# Patient Record
Sex: Female | Born: 1985 | Race: Black or African American | Hispanic: No | Marital: Single | Smoking: Current every day smoker
Health system: Southern US, Community
[De-identification: ages and names within clinical notes are randomized; demographics above are authoritative.]

## PROBLEM LIST (undated history)

## (undated) DIAGNOSIS — K259 Gastric ulcer, unspecified as acute or chronic, without hemorrhage or perforation: Secondary | ICD-10-CM

## (undated) DIAGNOSIS — R066 Hiccough: Secondary | ICD-10-CM

## (undated) DIAGNOSIS — F419 Anxiety disorder, unspecified: Secondary | ICD-10-CM

## (undated) DIAGNOSIS — K219 Gastro-esophageal reflux disease without esophagitis: Secondary | ICD-10-CM

## (undated) HISTORY — PX: NO PAST SURGERIES: SHX2092

## (undated) HISTORY — PX: LEG SURGERY: SHX1003

---

## 2006-07-11 ENCOUNTER — Emergency Department (HOSPITAL_COMMUNITY): Admission: EM | Admit: 2006-07-11 | Discharge: 2006-07-11 | Payer: Self-pay | Admitting: Emergency Medicine

## 2007-02-26 ENCOUNTER — Emergency Department (HOSPITAL_COMMUNITY): Admission: EM | Admit: 2007-02-26 | Discharge: 2007-02-26 | Payer: Self-pay | Admitting: Emergency Medicine

## 2009-03-16 ENCOUNTER — Ambulatory Visit: Payer: Self-pay | Admitting: Psychiatry

## 2009-03-16 ENCOUNTER — Emergency Department (HOSPITAL_COMMUNITY): Admission: EM | Admit: 2009-03-16 | Discharge: 2009-03-16 | Payer: Self-pay | Admitting: Emergency Medicine

## 2009-03-16 ENCOUNTER — Inpatient Hospital Stay (HOSPITAL_COMMUNITY): Admission: AD | Admit: 2009-03-16 | Discharge: 2009-03-18 | Payer: Self-pay | Admitting: Psychiatry

## 2010-10-22 LAB — CBC
HCT: 40.4 % (ref 36.0–46.0)
Platelets: 275 10*3/uL (ref 150–400)
WBC: 6.4 10*3/uL (ref 4.0–10.5)

## 2010-10-22 LAB — POCT PREGNANCY, URINE: Preg Test, Ur: NEGATIVE

## 2010-10-22 LAB — RAPID URINE DRUG SCREEN, HOSP PERFORMED
Amphetamines: NOT DETECTED
Barbiturates: NOT DETECTED
Benzodiazepines: NOT DETECTED

## 2010-10-22 LAB — BASIC METABOLIC PANEL
GFR calc Af Amer: >60 mL/min (ref >60)
GFR calc non Af Amer: >60 mL/min (ref >60)
Potassium: 3.5 mEq/L (ref 3.5–5.1)
Sodium: 137 mEq/L (ref 135–145)

## 2010-10-22 LAB — DIFFERENTIAL
Basophils Relative: 0 % (ref 0–1)
Eosinophils Absolute: 0 10*3/uL (ref 0.0–0.7)
Lymphs Abs: 1.1 10*3/uL (ref 0.7–4.0)
Neutrophils Relative %: 76 % (ref 43–77)

## 2010-11-29 NOTE — H&P (Signed)
[name], [name]                ACCOUNT NO.:  [id number]   MEDICAL RECORD NO.:  [id number]          PATIENT TYPE:  IPS   LOCATION:  0306                          FACILITY:  [facility]   PHYSICIAN:  Anselm Jungling, MD  DATE OF BIRTH:  09-12-1985   DATE OF ADMISSION:  03/16/2009  DATE OF DISCHARGE:                       PSYCHIATRIC ADMISSION ASSESSMENT   TIME:  0930.   IDENTIFYING INFORMATION:  A 25 year old female.  This is a voluntary  admission.   HISTORY OF PRESENT ILLNESS:  First [facility] admission for this is 25 year old  who was brought to the emergency room by her family.  Complaining of not  sleeping for the past 3 days.  She today is fully oriented.  Recognizes  that something is off in her thinking.  Admitting to having issues with  anxiety.  She is quite hyperverbal with rapid speech pace, but  cooperative.  Before arriving in the emergency room, she had taken a  trazodone 50 mg belonging to someone else that she believed was aspirin.   PAST PSYCHIATRIC HISTORY:  No previous treatment.  No history of  psychotropics.  No history of learning disability or brain injury.   SOCIAL HISTORY:  Single female enrolled in school, studying nursing.  Reports she is living with her grandmother.  No legal problems.  Denies  a history of alcohol or substance abuse.   FAMILY HISTORY:  Mother and maternal grandmother, both with bipolar  disorder.   PRIMARY CARE Solly Derasmo:  No regular primary care Kellyanne Ellwanger.   MEDICAL PROBLEMS:  None.   PAST MEDICAL HISTORY:  Childhood asthma.   CURRENT MEDICATIONS:  None.   DRUG ALLERGIES:  NONE.   PHYSICAL EXAMINATION:  Physical exam was done in the emergency room as  noted in the record.   LABORATORY DATA:  Chemistry is normal.  BUN 8, creatinine 0.67.  Urine  drug screen negative for all substances.  Alcohol level less than 5.  Normal CBC.  Urine pregnancy negative.   MENTAL STATUS EXAM:  Fully alert female, oriented x4, enjoyable, polite.  Nonpressured speech with rapid pace and hyperverbal.  Has a lot of  energy.  Affect and mood are bright.  Thinking, nonpsychotic.  Denying  any substance abuse.  Memory intact.   AXIS I:  Rule out bipolar disorder not otherwise specified, hypomanic.  AXIS II:  No diagnosis.  AXIS III:  No diagnosis.  AXIS IV:  Deferred.  AXIS V:  Current 44, past year not known.   PLAN:  The plan is to voluntarily admit her for stabilization.  We are  going to get in touch with her grandmother and hear any concerns that  she has.  She is on Ambien 5 mg h.s. p.r.n. insomnia and we are starting  her on Depakote 500 mg ER p.o. b.i.d.      Young Berry. Lorin Picket, N.P.      Anselm Jungling, MD  Electronically Signed    MAS/MEDQ  D:  03/17/2009  T:  03/17/2009  Job:  [id number]

## 2010-11-29 NOTE — Discharge Summary (Signed)
[name], BRIGHT NO.:  [id number]   MEDICAL RECORD NO.:  [id number]          PATIENT TYPE:  IPS   LOCATION:  0306                          FACILITY:  [facility]   PHYSICIAN:  Jasmine Pang, M.D. DATE OF BIRTH:  [birth year]   DATE OF ADMISSION:  03/16/2009  DATE OF DISCHARGE:  03/18/2009                               DISCHARGE SUMMARY   IDENTIFYING INFORMATION:  This is 25 year old single African American  female who was admitted on a voluntary basis on March 16, 2009.   HISTORY OF PRESENT ILLNESS:  This is the first [facility] admission for this 25-  year-old who was brought to the emergency room by her family.  She is  complaining of not sleeping for the past 3 days.  Today, she is fully  oriented.  She recognizes that something is often her thinking.  She is  admitted to having issues with anxiety.  She is quite hyperverbal with  rapid speech space, but cooperative.  Before arriving in the emergency  room, she had taken of trazodone 50 mg belonging someone else because  she believed it was aspirin.  She had no previous treatment.  No history  of psychotropics.  No history of learning disability or brain injury.  Upon admission, the patient was given an Axis I diagnosis of rule out  bipolar disorder, not otherwise specified, hypomanic.  On Axis III,  there was no diagnosis.   PHYSICAL FINDINGS:  There were no acute physical or medical problems  noted.  Her physical exam was done in the emergency room and is in her  record.   DIAGNOSTIC STUDIES:  Chemistry is normal.  BUN is 8, creatinine is 0.67.  Urine drug screen is negative for all substances.  Alcohol level less  than 5.  Normal CBC.  Urine pregnancy screen negative.   HOSPITAL COURSE:  Upon admission, the patient was started on Ambien 5 mg  p.o. q.h.s. p.r.n., may repeat times 1 if needed and Ativan 1 mg p.o.  q.6 h. p.r.n. anxiety.  She was also started on Depakote 500 mg p.o.  b.i.d.  The patient was less  hypomanic.  The following day, she had a  family session with her mother and grandmother, they were very  supportive, they felt comfortable taking her home and letting her adjust  to her Depakote there, she is not working and is not in school, and they  will be with her.  She did not feel she could rest as well on our unit.  She did sleep well on March 17, 2009.  On March 18, 2009, mental  status had improved from admission status.  The patient was still  somewhat hyperverbal, but the parents with mother and grandmother status  is normal for her.  Her mood was euthymic.  Affect wide range.  No  suicidal or homicidal ideation.  No thoughts of self injurious behavior.  No paranoia or delusions.  Thoughts were logical and goal-directed.  Thought content, no predominant theme.  Cognitive was grossly intact.  Insight good.  Judgment good.  Impulse control good. The  patient wanted  to go home today and was felt to be safe for discharge.  There was  indicated above her grandmother and mother are going to be with her  while she is adjusting to the Depakote.   DISCHARGE DIAGNOSES:  Axis I:  Single bipolar disorder, hypomanic.  Axis II:  None.  Axis III:  None.  AXIS IV:  Moderate (burden of psychiatric illness, but supportive family  is an asset).  Axis V:  Global assessment of functioning was 50 upon discharge.  Global  assessment of functioning was 44 upon admission.  Global assessment of  functioning highest past year was 65-70.   DISCHARGE PLANS:  There were no specific activity level or dietary  restrictions.   POSTHOSPITAL CARE PLANS:  The patient will go to the [facility] on  March 19, 2009 at 1:30 p.m.   DISCHARGE MEDICATIONS:  1. Depakote tablets 500 mg b.i.d.  2. Ambien 5 mg at bedtime if needed for sleep.      Jasmine Pang, M.D.  Electronically Signed     BHS/MEDQ  D:  03/18/2009  T:  03/18/2009  Job:  [id number]

## 2011-01-06 ENCOUNTER — Emergency Department (HOSPITAL_COMMUNITY)
Admission: EM | Admit: 2011-01-06 | Discharge: 2011-01-06 | Disposition: A | Discharge status: Home / Self Care | Payer: Self-pay | Attending: Emergency Medicine | Admitting: Emergency Medicine

## 2011-01-06 DIAGNOSIS — M7989 Other specified soft tissue disorders: Secondary | ICD-10-CM | POA: Insufficient documentation

## 2011-01-06 DIAGNOSIS — J45909 Unspecified asthma, uncomplicated: Secondary | ICD-10-CM | POA: Insufficient documentation

## 2011-01-06 DIAGNOSIS — W208XXA Other cause of strike by thrown, projected or falling object, initial encounter: Secondary | ICD-10-CM | POA: Insufficient documentation

## 2011-01-06 DIAGNOSIS — M79609 Pain in unspecified limb: Secondary | ICD-10-CM | POA: Insufficient documentation

## 2011-01-06 DIAGNOSIS — S5010XA Contusion of unspecified forearm, initial encounter: Secondary | ICD-10-CM | POA: Insufficient documentation

## 2011-01-06 DIAGNOSIS — Y92009 Unspecified place in unspecified non-institutional (private) residence as the place of occurrence of the external cause: Secondary | ICD-10-CM | POA: Insufficient documentation

## 2011-01-06 LAB — POCT PREGNANCY, URINE: Preg Test, Ur: NEGATIVE

## 2011-05-01 LAB — URINALYSIS, ROUTINE W REFLEX MICROSCOPIC
Bilirubin Urine: NEGATIVE
Hgb urine dipstick: NEGATIVE
Ketones, ur: NEGATIVE
Protein, ur: NEGATIVE

## 2011-05-01 LAB — DIFFERENTIAL
Eosinophils Absolute: 0.2
Eosinophils Relative: 2
Lymphocytes Relative: 14
Lymphs Abs: 1.4
Monocytes Absolute: 0.6
Monocytes Relative: 7

## 2011-05-01 LAB — CBC
Platelets: 317
WBC: 9.8

## 2011-05-01 LAB — POCT I-STAT CREATININE
Creatinine, Ser: 0.8
Operator id: 234501

## 2011-05-01 LAB — WET PREP, GENITAL: WBC, Wet Prep HPF POC: NONE SEEN

## 2011-05-01 LAB — I-STAT 8, (EC8 V) (CONVERTED LAB)
Acid-base deficit: 2
Chloride: 102
HCT: 38
Potassium: 3.9
pH, Ven: 7.325 — ABNORMAL HIGH

## 2011-05-01 LAB — RPR: RPR Ser Ql: NONREACTIVE

## 2011-09-19 ENCOUNTER — Emergency Department (HOSPITAL_COMMUNITY)
Admission: EM | Admit: 2011-09-19 | Discharge: 2011-09-20 | Disposition: A | Discharge status: Home / Self Care | Payer: Self-pay | Attending: Emergency Medicine | Admitting: Emergency Medicine

## 2011-09-19 ENCOUNTER — Other Ambulatory Visit: Payer: Self-pay

## 2011-09-19 DIAGNOSIS — F410 Panic disorder [episodic paroxysmal anxiety] without agoraphobia: Secondary | ICD-10-CM | POA: Insufficient documentation

## 2011-09-19 DIAGNOSIS — R209 Unspecified disturbances of skin sensation: Secondary | ICD-10-CM | POA: Insufficient documentation

## 2011-09-19 DIAGNOSIS — R064 Hyperventilation: Secondary | ICD-10-CM | POA: Insufficient documentation

## 2011-09-19 DIAGNOSIS — F419 Anxiety disorder, unspecified: Secondary | ICD-10-CM

## 2011-09-19 DIAGNOSIS — F411 Generalized anxiety disorder: Secondary | ICD-10-CM | POA: Insufficient documentation

## 2011-09-19 MED ORDER — ALPRAZOLAM 0.5 MG PO TABS
0.2500 mg | ORAL_TABLET | Freq: Once | ORAL | Status: AC
  Administered 2011-09-19: 0.25 mg via ORAL
  Filled 2011-09-19: qty 1

## 2011-09-19 MED ORDER — ALPRAZOLAM 0.25 MG PO TABS: 0.2500 mg | ORAL_TABLET | Freq: Every evening | ORAL | Status: AC | PRN

## 2011-09-19 NOTE — Discharge Instructions (Signed)

## 2011-09-19 NOTE — ED Notes (Signed)
Pt in waiting room reports feeling better; no signs of distress; reports tightness in chest but no difficulty breathing anymore.

## 2011-09-19 NOTE — ED Notes (Signed)
States 3 panic attacks this week; has been able to calm self during first 2 episodes; however, not tonight; states has been under a lot of stressors; mother's best friend just died so pt has been trying to care for mother and support her emotionally; states "I feel overwhelmed and overexerted" ; tonight pt developed sob, chest pain, feeling "out of control" - did not resolve on own; hence, she presented to ED; appears calm upon entering room; however, speech is pressured; denies SI nor HI at this time; states has previously saw MD for anxiety; however, no long term treatment; significant other at bedside

## 2011-09-19 NOTE — ED Provider Notes (Signed)
History     CSN: [id number]  Arrival date & time 09/19/11  1925   First MD Initiated Contact with Patient 09/19/11 2213      Chief Complaint  Patient presents with  . Panic Attack    (Consider location/radiation/quality/duration/timing/severity/associated sxs/prior treatment) HPI Pt has history of panic attacks and was under increased stress today and started hyperventilating, had the feeling of suffocation, chest tightness, and bl hand tingling. Since pt has been in ED Her symptoms are resolving. No fever chills, cough, N/V, leg swelling.  No past medical history on file.  No past surgical history on file.  No family history on file.  History  Substance Use Topics  . Smoking status: Not on file  . Smokeless tobacco: Not on file  . Alcohol Use: Not on file    OB History    No data available      Review of Systems  Constitutional: Negative for fever and chills.  HENT: Positive for neck pain.   Respiratory: Positive for chest tightness and shortness of breath. Negative for cough and wheezing.   Cardiovascular: Negative for chest pain, palpitations and leg swelling.  Neurological: Negative for dizziness, weakness, numbness and headaches.  Psychiatric/Behavioral: Negative for suicidal ideas and self-injury. The patient is nervous/anxious.     Allergies  Review of patient's allergies indicates no known allergies.  Home Medications   Current Outpatient Rx  Name Route Sig Dispense Refill  . ALPRAZOLAM 0.25 MG PO TABS Oral Take 1 tablet (0.25 mg total) by mouth at bedtime as needed for anxiety. 30 tablet 0    BP 124/75  Pulse 98  Temp(Src) 98.3 F (36.8 C) (Oral)  Resp 17  SpO2 100%  Physical Exam  Nursing note and vitals reviewed. Constitutional: She is oriented to person, place, and time. She appears well-developed and well-nourished. No distress.       Pressured speech  HENT:  Head: Normocephalic and atraumatic.  Mouth/Throat: Oropharynx is clear and  moist.  Eyes: EOM are normal. Pupils are equal, round, and reactive to light.  Neck: Normal range of motion. Neck supple.  Cardiovascular: Normal rate and regular rhythm.  Exam reveals no gallop and no friction rub.   No murmur heard. Pulmonary/Chest: Effort normal and breath sounds normal. No respiratory distress. She has no wheezes. She has no rales.  Abdominal: Soft. Bowel sounds are normal. There is no tenderness. There is no rebound and no guarding.  Musculoskeletal: Normal range of motion. She exhibits no edema and no tenderness.       No calf tenderness  Neurological: She is alert and oriented to person, place, and time.       5/5 motor in all ext, sensation intact  Skin: Skin is warm and dry. No rash noted. No erythema.  Psychiatric:       Anxious appearing    ED Course  Procedures (including critical care time)  Labs Reviewed - No data to display No results found.   1. Anxiety      Date: 09/19/2011  Rate: 94  Rhythm: normal sinus rhythm  QRS Axis: normal  Intervals: normal  ST/T Wave abnormalities: normal  Conduction Disutrbances:none  Narrative Interpretation:   Old EKG Reviewed: unchanged    MDM  Will treat symptomatically and reassess.     Feeling better. ACT to give resources for outpatient followup. Return for conocerns  Loren Racer, MD 09/19/11 2337

## 2011-09-19 NOTE — ED Notes (Signed)
Pt c/o panic attack onset approx 1 hr ago.  Pt st's she was breathing fast then hands started going numb.  St's chest  Felt tight

## 2011-10-24 ENCOUNTER — Emergency Department (HOSPITAL_COMMUNITY)
Admission: EM | Admit: 2011-10-24 | Discharge: 2011-10-24 | Disposition: A | Discharge status: Home / Self Care | Payer: Self-pay | Attending: Emergency Medicine | Admitting: Emergency Medicine

## 2011-10-24 ENCOUNTER — Encounter (HOSPITAL_COMMUNITY): Payer: Self-pay | Admitting: Emergency Medicine

## 2011-10-24 ENCOUNTER — Emergency Department (HOSPITAL_COMMUNITY): Payer: Self-pay

## 2011-10-24 DIAGNOSIS — R066 Hiccough: Secondary | ICD-10-CM | POA: Insufficient documentation

## 2011-10-24 DIAGNOSIS — F411 Generalized anxiety disorder: Secondary | ICD-10-CM | POA: Insufficient documentation

## 2011-10-24 DIAGNOSIS — R079 Chest pain, unspecified: Secondary | ICD-10-CM | POA: Insufficient documentation

## 2011-10-24 DIAGNOSIS — J45909 Unspecified asthma, uncomplicated: Secondary | ICD-10-CM | POA: Insufficient documentation

## 2011-10-24 HISTORY — DX: Anxiety disorder, unspecified: F41.9

## 2011-10-24 MED ORDER — GI COCKTAIL ~~LOC~~
30.0000 mL | Freq: Once | ORAL | Status: AC
  Administered 2011-10-24: 30 mL via ORAL
  Filled 2011-10-24: qty 30

## 2011-10-24 MED ORDER — FAMOTIDINE 20 MG PO TABS: 20.0000 mg | ORAL_TABLET | Freq: Two times a day (BID) | ORAL | Status: DC

## 2011-10-24 MED ORDER — FAMOTIDINE 20 MG PO TABS
20.0000 mg | ORAL_TABLET | Freq: Once | ORAL | Status: AC
  Administered 2011-10-24: 20 mg via ORAL
  Filled 2011-10-24: qty 1

## 2011-10-24 NOTE — ED Notes (Addendum)
Pt reports approx 4 days of intermittent hiccups and belching, progressively worse and more consistent - pt states she gets some relief of sx after taking gas-x. Pt c/o upper chest soreness. Pt in no acute distress, friend at bedside.

## 2011-10-24 NOTE — ED Notes (Signed)
PT. REPORTS INTERMITTENT MID CHEST PAIN WITH VOMITTING AND SOB FOR SEVERAL DAYS . DENIES COUGH OR CONGESTION .

## 2011-10-24 NOTE — Discharge Instructions (Signed)
 RESOURCE GUIDE  Dental Problems  Patients with Medicaid: [facility]                     [facility] [address]                                           [address] Phone:  [phone]                                                  Phone:  [phone]  If unable to pay or uninsured, contact:  [facility] or [facility] to become qualified for the adult dental clinic.  Chronic Pain Problems Contact [facility]  [phone] Patients need to be referred by their primary care doctor.  Insufficient Money for Medicine Contact [employer]:  call "211" or [facility] [phone].  No Primary Care Doctor Call [facility]  [phone] Other agencies that provide inexpensive medical care    [facility]  [phone]    [facility]  [phone]    [facility]  [phone]    [facility] Clinic  [phone]    [facility]  [phone]    [facility]  [phone]  Psychological Services [facility]  [phone] [facility]  [phone] [facility]   [phone] (emergency services [phone])  Substance Abuse Resources [facility]  [phone] [facility] 336-784-9470 [facility] 336-285-9073 [facility] 336-845-3988 Residential & Outpatient Substance Abuse Program  800-659-3381  Abuse/Neglect [place] Child Abuse Hotline (336) 641-3795 [place] Child Abuse Hotline 800-378-5315 (After Hours)  Emergency Shelter [facility] (336) 271-5985  [facility] (336) 275-9566 [facility] (704) [phone]  MRSA Hotline #:   832-7006    [place] Resources  [facility] [place]     [employer]                          [facility] [address] [place]                       [address]      [address]  [place]                                                [place]                            [place] Phone:  [phone]                                   Phone:  [phone]                 Phone:  [phone]  [facility] Phone:    [phone]  [place] Child Abuse Hotline (336) 342-1394 (336) 342-3537 (After Hours)   

## 2011-10-24 NOTE — ED Provider Notes (Signed)
History     CSN: [id number]  Arrival date & time 10/24/11  0302   First MD Initiated Contact with Patient 10/24/11 [phone]      Chief Complaint  Patient presents with  . Chest Pain    (Consider location/radiation/quality/duration/timing/severity/associated sxs/prior treatment) HPI Comments: 26 year old female who has mild asthma and anxiety presents with approximately 36 hours of intermittent burping and hiccuping. She states that this started gradually, has gradually worsened and is now persistent. She admits to having mild chest pain when she hiccups, no coughing or shortness of breath, no back pain no diarrhea no dysuria no rashes no swelling. She is in no medications prior to arrival, nothing makes this better or worse. She denies having excessive flatus and has had normal bowel movements.  Patient is a 26 y.o. female presenting with chest pain. The history is provided by the patient and a friend.  Chest Pain     Past Medical History  Diagnosis Date  . Anxiety   . Asthma     History reviewed. No pertinent past surgical history.  No family history on file.  History  Substance Use Topics  . Smoking status: Current Everyday Smoker  . Smokeless tobacco: Not on file  . Alcohol Use: No    OB History    Grav Para Term Preterm Abortions TAB SAB Ect Mult Living                  Review of Systems  Cardiovascular: Positive for chest pain.  All other systems reviewed and are negative.    Allergies  Review of patient's allergies indicates no known allergies.  Home Medications   Current Outpatient Rx  Name Route Sig Dispense Refill  . ALPRAZOLAM 0.25 MG PO TABS Oral Take 0.25 mg by mouth at bedtime as needed. For sleep    . OVER THE COUNTER MEDICATION Oral Take 1 capsule by mouth at bedtime as needed. For congestion and sleep    . SIMETHICONE 80 MG PO CHEW Oral Chew 80 mg by mouth every 6 (six) hours as needed. For stomach upset    . FAMOTIDINE 20 MG PO TABS Oral Take  1 tablet (20 mg total) by mouth 2 (two) times daily. 30 tablet 0    BP 117/75  Pulse 71  Temp(Src) 98 F (36.7 C) (Oral)  Resp 14  SpO2 98%  LMP 10/11/2011  Physical Exam  Nursing note and vitals reviewed. Constitutional: She appears well-developed and well-nourished. No distress.  HENT:  Head: Normocephalic and atraumatic.  Mouth/Throat: Oropharynx is clear and moist. No oropharyngeal exudate.  Eyes: Conjunctivae and EOM are normal. Pupils are equal, round, and reactive to light. Right eye exhibits no discharge. Left eye exhibits no discharge. No scleral icterus.  Neck: Normal range of motion. Neck supple. No JVD present. No thyromegaly present.  Cardiovascular: Normal rate, regular rhythm, normal heart sounds and intact distal pulses.  Exam reveals no gallop and no friction rub.   No murmur heard. Pulmonary/Chest: Effort normal and breath sounds normal. No respiratory distress. She has no wheezes. She has no rales.  Abdominal: Soft. Bowel sounds are normal. She exhibits no distension and no mass. There is no tenderness.  Musculoskeletal: Normal range of motion. She exhibits no edema and no tenderness.  Lymphadenopathy:    She has no cervical adenopathy.  Neurological: She is alert. Coordination normal.  Skin: Skin is warm and dry. No rash noted. No erythema.  Psychiatric: She has a normal mood and affect. Her  behavior is normal.    ED Course  Procedures (including critical care time)  ED ECG REPORT   Date: 10/24/2011   Rate: 85  Rhythm: normal sinus rhythm  QRS Axis: normal  Intervals: normal  ST/T Wave abnormalities: normal  Conduction Disutrbances:none  Narrative Interpretation:   Old EKG Reviewed: MARCH %TH< NO SIGNIFICANT CHANGES   Labs Reviewed - No data to display Dg Chest 2 View  10/24/2011  *RADIOLOGY REPORT*  Clinical Data: Chest pain.  CHEST - 2 VIEW  Comparison: None.  Findings: The lungs are well-aerated and clear.  There is no evidence of focal  opacification, pleural effusion or pneumothorax.  The heart is normal in size; the mediastinal contour is within normal limits.  No acute osseous abnormalities are seen.  Focal densities overlying the upper lung zones appear to be outside the patient.  IMPRESSION: No acute cardiopulmonary process seen.  Original Report Authenticated By: Tonia Ghent, M.D.     1. [place]       MDM  Patient hiccups throughout the exam, she has gas with belching throughout the exam. She has normal vital signs including oxygen levels are normal on room air, 98% on my evaluation. Chest x-ray shows no abnormalities, EKG is normal with no acute findings. I discussed these results with the patient and encourage her to use Pepcid and a GI cocktail here.  Has had significant improvement in sx with GI cocktail and pepcid, home on same.  Resource list given  Discharge Prescriptions include:  pepcid       Vida Roller, MD 10/24/11 [phone]

## 2011-11-07 ENCOUNTER — Emergency Department (HOSPITAL_COMMUNITY)
Admission: EM | Admit: 2011-11-07 | Discharge: 2011-11-07 | Disposition: A | Discharge status: Home / Self Care | Payer: Self-pay | Attending: Emergency Medicine | Admitting: Emergency Medicine

## 2011-11-07 ENCOUNTER — Encounter (HOSPITAL_COMMUNITY): Payer: Self-pay | Admitting: *Deleted

## 2011-11-07 DIAGNOSIS — R066 Hiccough: Secondary | ICD-10-CM

## 2011-11-07 DIAGNOSIS — E876 Hypokalemia: Secondary | ICD-10-CM | POA: Insufficient documentation

## 2011-11-07 DIAGNOSIS — R55 Syncope and collapse: Secondary | ICD-10-CM | POA: Insufficient documentation

## 2011-11-07 DIAGNOSIS — R141 Gas pain: Secondary | ICD-10-CM | POA: Insufficient documentation

## 2011-11-07 DIAGNOSIS — R0602 Shortness of breath: Secondary | ICD-10-CM | POA: Insufficient documentation

## 2011-11-07 DIAGNOSIS — R142 Eructation: Secondary | ICD-10-CM | POA: Insufficient documentation

## 2011-11-07 DIAGNOSIS — F411 Generalized anxiety disorder: Secondary | ICD-10-CM | POA: Insufficient documentation

## 2011-11-07 DIAGNOSIS — F17200 Nicotine dependence, unspecified, uncomplicated: Secondary | ICD-10-CM | POA: Insufficient documentation

## 2011-11-07 DIAGNOSIS — J45909 Unspecified asthma, uncomplicated: Secondary | ICD-10-CM | POA: Insufficient documentation

## 2011-11-07 DIAGNOSIS — R079 Chest pain, unspecified: Secondary | ICD-10-CM | POA: Insufficient documentation

## 2011-11-07 LAB — POCT I-STAT, CHEM 8
BUN: 5 mg/dL — ABNORMAL LOW (ref 6–23)
Calcium, Ion: 1.21 mmol/L (ref 1.12–1.32)
Chloride: 108 mEq/L (ref 96–112)
Glucose, Bld: 106 mg/dL — ABNORMAL HIGH (ref 70–99)
HCT: 39 % (ref 36.0–46.0)
TCO2: 20 mmol/L (ref 0–100)

## 2011-11-07 MED ORDER — SODIUM CHLORIDE 0.9 % IV BOLUS (SEPSIS): 1000.0000 mL | Freq: Once | INTRAVENOUS | Status: DC

## 2011-11-07 MED ORDER — GI COCKTAIL ~~LOC~~
30.0000 mL | Freq: Once | ORAL | Status: AC
  Administered 2011-11-07: 30 mL via ORAL
  Filled 2011-11-07: qty 30

## 2011-11-07 MED ORDER — POTASSIUM CHLORIDE CRYS ER 20 MEQ PO TBCR
40.0000 meq | EXTENDED_RELEASE_TABLET | Freq: Once | ORAL | Status: AC
  Administered 2011-11-07: 40 meq via ORAL
  Filled 2011-11-07: qty 2

## 2011-11-07 MED ORDER — CHLORPROMAZINE HCL 25 MG/ML IJ SOLN
25.0000 mg | Freq: Once | INTRAMUSCULAR | Status: AC
  Administered 2011-11-07: 25 mg via INTRAMUSCULAR
  Filled 2011-11-07: qty 1

## 2011-11-07 NOTE — ED Notes (Signed)
Nurse tech performed orthostatics before patient discharge (per verbal orders by Dr. Lynelle Doctor).  Patient passed out while being taken to discharge desk in wheelchair; Dr. Lynelle Doctor aware.  Patient placed back in room.  Will continue to monitor.

## 2011-11-07 NOTE — ED Notes (Signed)
The pt is c/o diffiuclty breathing and she is hyperventilating.  She has rapid speech.  Constant burping. agitated

## 2011-11-07 NOTE — ED Notes (Signed)
Patient refused IV start; states, "I don't want my veins to blow".  Patient very anxious and paranoid; Dr. Lynelle Doctor notified.  Patient given drinks and water; patient states, "I would rather drink a bunch of water than to have IV started".

## 2011-11-07 NOTE — Discharge Instructions (Signed)
Continue the famotidine twice a day with the prilosec daily. You can take TUMS for symptoms between doses of these medications. Call Dr Haywood Pao office to get an appointment to be rechecked. He is the gastroenterologist on call.  Consider going to [facility] to have your anxiety evaluated.  Recheck if you feel worse.   RESOURCE GUIDE  No Primary Care Doctor Call [facility]  [phone] Other agencies that provide inexpensive medical care    [facility]  [phone]    [facility]  [phone]    [facility]  [phone]    [facility]  [phone]    [facility]  [phone]    [facility]  [phone]  Psychological Services [facility]  [phone] [facility]  [phone] [facility]   [phone] (emergency services [phone])           Hiccups Hiccups are caused by a sudden contraction of the muscles between the ribs and the muscle under your lungs (diaphragm). When you hiccup, the top of your windpipe (glottis) closes immediately after your diaphragm contracts. This makes the typical 'hic' sound. A hiccup is a reflex that you cannot stop. Unlike other reflexes such as coughing and sneezing, hiccups do not seem to have any useful purpose. There are 3 types of hiccups:   Benign bouts: last less than 48 hours.   Persistent: last more than 48 hours but less than 1 month.   Intractable: last more than 1 month.  CAUSES  Most people have bouts of hiccups from time to time. They start for no apparent reason, last a short while, and then stop. Sometimes they are due to:  A temporary swollen stomach caused by overeating or eating too fast, eating spicy foods, drinking fizzy drinks, or swallowing air.   A sudden change in temperature (very hot or cold foods or drinks, a cold shower).   Drinking alcohol or using tobacco.  There are no particular tests used to diagnose hiccups. Hiccups  are usually considered harmless and do not point to a serious medical condition. However, there can be underlying medical problems that may cause hiccups, such as pneumonia, diabetes, metabolic problems, tumors, abdominal infections or injuries, and neurologic problems.You must follow up with your caregiver if your symptoms persist or become a frequent problem. TREATMENT   Most cases need no treatment. A bout of benign hiccups usually does not last long.   Medicine is sometimes needed to stop persistent hiccups. Medicine may be given intravenously (IV) or by mouth.   Hypnosis or acupuncture may be suggested.   Surgery to affect the nerve that supplies the diaphragm may be tried in severe cases.   Treatment of an underlying cause is needed in some cases.  HOME CARE INSTRUCTIONS  Popular remedies that may stop a bout of hiccups include:  Gargling ice water.   Swallowing granulated sugar.   Biting on a lemon.   Holding your breath, breathing fast, or breathing into a paper bag.   Bearing down.   Gasping after a sudden fright.   Pulling your tongue gently.   Distraction.

## 2011-11-07 NOTE — ED Notes (Signed)
Patient given water and coke to drink; informed patient that she needs to increase her liquid intake before we can allow her to go home.

## 2011-11-07 NOTE — ED Notes (Signed)
Patient given discharge paperwork; went over discharge instructions with patient.  Instructed patient to continue her medication regimen, as directed by Dr. Lynelle Doctor, to follow up with the gastroenterologist, and to [facility] with [facility] about her [facility].  Instructed to return to the ED for new, worsening, or concerning symptoms.

## 2011-11-07 NOTE — ED Notes (Signed)
Patient at bedside with GI cocktail; patient refusing to drink at this time.  Will continue to encourage.

## 2011-11-07 NOTE — ED Notes (Signed)
Received bedside report from [place], [place].  Dr. Lynelle Doctor currently at bedside; will continue to monitor.

## 2011-11-07 NOTE — ED Provider Notes (Addendum)
History     CSN: [id number]  Arrival date & time 11/07/11  0153   First MD Initiated Contact with Patient 11/07/11 [phone]      Chief Complaint  Patient presents with  . Chest Pain   Level V caveat for poor historian  (Consider location/radiation/quality/duration/timing/severity/associated sxs/prior treatment) HPI  Patient is hard to direct in her history. She states she's had an elephant sitting on her chest since March 5. She also relates she's been hiccuping off and on since that time. She feels short of breath and the pain is located in the left chest. She states at night she has wheezing when she feels tired at bedtime. She's had a cough with posttussive vomiting. She has lost appetite and she states she's lost weight but the man with her denies noticing weight loss. She denies fever or sore throat. Patient was seen a week ago and was started on Pepcid and Prilosec which has helped. Patient relates she has a history of anxiety.  PCP none  Past Medical History  Diagnosis Date  . Anxiety   . Asthma     History reviewed. No pertinent past surgical history.  No family history on file. GERD MOP   History   Social History  . Marital Status: Single    Spouse Name: N/A    Number of Children: N/A  . Years of Education: N/A   Occupational History  . Not on file.   Social History Main Topics  . Smoking status: Current Everyday Smoker  . Smokeless tobacco: Not on file  . Alcohol Use: No  . Drug Use: No  . Sexually Active:     denies street drugs Moved here from [place] in 2011  OB History    Grav Para Term Preterm Abortions TAB SAB Ect Mult Living                  Review of Systems  All other systems reviewed and are negative.    Allergies  Review of patient's allergies indicates no known allergies.  Home Medications   Current Outpatient Rx  Name Route Sig Dispense Refill  . FAMOTIDINE 20 MG PO TABS Oral Take 20 mg by mouth 2 (two) times daily.    Marland Kitchen  OMEPRAZOLE 20 MG PO CPDR Oral Take 20 mg by mouth daily.    . NYQUIL PO Oral Take 30 mLs by mouth at bedtime as needed. For sleep    . SIMETHICONE 80 MG PO CHEW Oral Chew 80 mg by mouth every 6 (six) hours as needed. For stomach upset      BP 115/80  Pulse 87  Temp(Src) 98.1 F (36.7 C) (Oral)  Resp 22  SpO2 100%  LMP 10/11/2011  Vital signs normal    Physical Exam  Constitutional: She is oriented to person, place, and time.  Non-toxic appearance. She does not appear ill. No distress.       Thin, constantly gulping air, burping and hiccuping  HENT:  Head: Normocephalic and atraumatic.  Right Ear: External ear normal.  Left Ear: External ear normal.  Nose: Nose normal. No mucosal edema or rhinorrhea.  Mouth/Throat: Oropharynx is clear and moist and mucous membranes are normal. No dental abscesses or uvula swelling.  Eyes: Conjunctivae and EOM are normal. Pupils are equal, round, and reactive to light.  Neck: Normal range of motion and full passive range of motion without pain. Neck supple.  Cardiovascular: Normal rate, regular rhythm and normal heart sounds.  Exam reveals no  gallop and no friction rub.   No murmur heard. Pulmonary/Chest: Effort normal and breath sounds normal. No respiratory distress. She has no wheezes. She has no rhonchi. She has no rales. She exhibits no tenderness and no crepitus.  Abdominal: Soft. Normal appearance and bowel sounds are normal. She exhibits no distension. There is no tenderness. There is no rebound and no guarding.  Musculoskeletal: Normal range of motion. She exhibits no edema and no tenderness.       Moves all extremities well.   Neurological: She is alert and oriented to person, place, and time. She has normal strength. No cranial nerve deficit.  Skin: Skin is warm, dry and intact. No rash noted. No erythema. No pallor.  Psychiatric: Her speech is normal and behavior is normal. Her mood appears not anxious.       anxious    ED Course    Procedures (including critical care time)   Medications  gi cocktail (Maalox,Lidocaine,Donnatal) (30 mL Oral Given 11/07/11 0356)  chlorproMAZINE (THORAZINE) injection 25 mg (25 mg Intramuscular Given 11/07/11 0330)   05:15 Hiccups are gone, burping is less, feels ready to go home.   05:30 Pt passed out as leaving the ED, her orthostatics are +, will give IV fluids.   05:55 Pt refused to let the nurse start an IV, will try oral fluids.  She had an Istat 8 done since she passed out and her potassium was barely low. She was given oral potassium.   06:44 pt able to ambulate to discharge, states she is feeling much better.   Pt had a normal CXR her last ED visit on the ninth.  Results for orders placed during the hospital encounter of 11/07/11  POCT I-STAT, CHEM 8      Component Value Range   Sodium 140  135 - 145 (mEq/L)   Potassium 3.4 (*) 3.5 - 5.1 (mEq/L)   Chloride 108  96 - 112 (mEq/L)   BUN 5 (*) 6 - 23 (mg/dL)   Creatinine, Ser 1.61  0.50 - 1.10 (mg/dL)   Glucose, Bld 096 (*) 70 - 99 (mg/dL)   Calcium, Ion 0.45  4.09 - 1.32 (mmol/L)   TCO2 20  0 - 100 (mmol/L)   Hemoglobin 13.3  12.0 - 15.0 (g/dL)   HCT 81.1  91.4 - 78.2 (%)    Laboratory interpretation all normal except mild hypokalemia  Dg Chest 2 View  10/24/2011  *RADIOLOGY REPORT*  Clinical Data: Chest pain.  CHEST - 2 VIEW  Comparison: None.  Findings: The lungs are well-aerated and clear.  There is no evidence of focal opacification, pleural effusion or pneumothorax.  The heart is normal in size; the mediastinal contour is within normal limits.  No acute osseous abnormalities are seen.  Focal densities overlying the upper lung zones appear to be outside the patient.  IMPRESSION: No acute cardiopulmonary process seen.  Original Report Authenticated By: Tonia Ghent, M.D.       Date: 11/07/2011  Rate: 93  Rhythm: normal sinus rhythm  QRS Axis: normal  Intervals: normal  ST/T Wave abnormalities: normal   Conduction Disutrbances:none  Narrative Interpretation:   Old EKG Reviewed: unchanged from 10/24/2011    1. Intractable hiccups   2. Belching   3. Syncope   4. Hypokalemia    Plan discharge  Devoria Albe, MD, FACEP     MDM          Ward Givens, MD 11/07/11 9562  Ward Givens, MD 11/07/11 1308

## 2011-11-07 NOTE — ED Notes (Signed)
Informed Dr. Lynelle Doctor that patient has finished taking potassium -- patient able to be discharged home.

## 2011-11-07 NOTE — ED Notes (Signed)
Lab at bedside

## 2011-11-07 NOTE — ED Notes (Signed)
Patient currently sitting up in bed; no respiratory or acute distress noted.  Patient updated on plan of care; informed patient that we are currently waiting further orders/disposition from EDP.  Will continue to monitor.

## 2011-11-08 ENCOUNTER — Encounter (HOSPITAL_COMMUNITY): Payer: Self-pay | Admitting: *Deleted

## 2011-11-08 ENCOUNTER — Emergency Department (HOSPITAL_COMMUNITY)
Admission: EM | Admit: 2011-11-08 | Discharge: 2011-11-08 | Disposition: A | Discharge status: Home Health | Payer: Self-pay | Attending: Emergency Medicine | Admitting: Emergency Medicine

## 2011-11-08 DIAGNOSIS — Z79899 Other long term (current) drug therapy: Secondary | ICD-10-CM | POA: Insufficient documentation

## 2011-11-08 DIAGNOSIS — R0609 Other forms of dyspnea: Secondary | ICD-10-CM | POA: Insufficient documentation

## 2011-11-08 DIAGNOSIS — K219 Gastro-esophageal reflux disease without esophagitis: Secondary | ICD-10-CM | POA: Insufficient documentation

## 2011-11-08 DIAGNOSIS — R0989 Other specified symptoms and signs involving the circulatory and respiratory systems: Secondary | ICD-10-CM | POA: Insufficient documentation

## 2011-11-08 DIAGNOSIS — R079 Chest pain, unspecified: Secondary | ICD-10-CM | POA: Insufficient documentation

## 2011-11-08 DIAGNOSIS — F17200 Nicotine dependence, unspecified, uncomplicated: Secondary | ICD-10-CM | POA: Insufficient documentation

## 2011-11-08 DIAGNOSIS — F411 Generalized anxiety disorder: Secondary | ICD-10-CM | POA: Insufficient documentation

## 2011-11-08 DIAGNOSIS — R630 Anorexia: Secondary | ICD-10-CM | POA: Insufficient documentation

## 2011-11-08 DIAGNOSIS — R0602 Shortness of breath: Secondary | ICD-10-CM | POA: Insufficient documentation

## 2011-11-08 DIAGNOSIS — R1110 Vomiting, unspecified: Secondary | ICD-10-CM | POA: Insufficient documentation

## 2011-11-08 DIAGNOSIS — J45909 Unspecified asthma, uncomplicated: Secondary | ICD-10-CM | POA: Insufficient documentation

## 2011-11-08 DIAGNOSIS — R1013 Epigastric pain: Secondary | ICD-10-CM | POA: Insufficient documentation

## 2011-11-08 DIAGNOSIS — F419 Anxiety disorder, unspecified: Secondary | ICD-10-CM

## 2011-11-08 MED ORDER — GI COCKTAIL ~~LOC~~
30.0000 mL | Freq: Once | ORAL | Status: AC
  Administered 2011-11-08: 30 mL via ORAL
  Filled 2011-11-08: qty 30

## 2011-11-08 MED ORDER — LORAZEPAM 1 MG PO TABS
1.0000 mg | ORAL_TABLET | Freq: Once | ORAL | Status: AC
  Administered 2011-11-08: 1 mg via ORAL
  Filled 2011-11-08: qty 1

## 2011-11-08 MED ORDER — SUCRALFATE 1 GM/10ML PO SUSP: 1.0000 g | Freq: Four times a day (QID) | ORAL | Status: DC | PRN

## 2011-11-08 NOTE — Discharge Instructions (Signed)

## 2011-11-08 NOTE — ED Notes (Signed)
Discharge instructions reviewed with pt; verbalizes understanding.  No questions asked; No further c/o's voiced.  Pt ambulatory to lobby with friend.  NAD noted.

## 2011-11-08 NOTE — ED Notes (Signed)
Patient is very anxious.  States she cannot breath.  Pulse ox and heartrate ok.  Reassured patient

## 2011-11-08 NOTE — ED Notes (Signed)
Patient was seen her for same on yesterday,  She was dx with gerd and told to take meds.  She has not been able to sleep due to feeling like her heart is going to explode

## 2011-11-08 NOTE — ED Provider Notes (Addendum)
History     CSN: [id number]  Arrival date & time 11/08/11  1317   First MD Initiated Contact with Patient 11/08/11 1502      Chief Complaint  Patient presents with  . Shortness of Breath  . Chest Pain     HPI This young female with history of anxiety and recent diagnosis of GERD now presents with concerns over ongoing epigastric discomfort, chest pain, dyspnea, anorexia.  She notes that since discharge yesterday she is not successfully initiated Prilosec therapy.  Since history she has had persistent discomfort, focally about her epigastrium and chest.  The pain is burning, otherwise nonradiating, nonexertional, nonpleuritic.  She notes that there has been anorexia, but this is typical for her.  She denies any fevers, chills, headache, syncope, confusion. The patient has not tried any of her medication for anxiety during this episode. Past Medical History  Diagnosis Date  . Anxiety   . Asthma     History reviewed. No pertinent past surgical history.  No family history on file.  History  Substance Use Topics  . Smoking status: Current Everyday Smoker  . Smokeless tobacco: Not on file  . Alcohol Use: No    OB History    Grav Para Term Preterm Abortions TAB SAB Ect Mult Living                  Review of Systems  Constitutional:       HPI  HENT:       HPI otherwise negative  Eyes: Negative.   Respiratory:       HPI, otherwise negative  Cardiovascular:       HPI, otherwise nmegative  Gastrointestinal: Positive for vomiting. Negative for diarrhea.  Genitourinary:       HPI, otherwise negative  Musculoskeletal:       HPI, otherwise negative  Skin: Negative.   Neurological: Negative for syncope.    Allergies  Review of patient's allergies indicates no known allergies.  Home Medications   Current Outpatient Rx  Name Route Sig Dispense Refill  . FAMOTIDINE 20 MG PO TABS Oral Take 20 mg by mouth 2 (two) times daily.    Marland Kitchen OMEPRAZOLE 20 MG PO CPDR Oral Take 20  mg by mouth daily.    . NYQUIL PO Oral Take 30 mLs by mouth at bedtime as needed. For sleep    . SIMETHICONE 80 MG PO CHEW Oral Chew 80 mg by mouth every 6 (six) hours as needed. For stomach upset      BP 105/77  Pulse 95  Temp(Src) 98.2 F (36.8 C) (Oral)  Resp 22  SpO2 99%  LMP 10/11/2011  Physical Exam  Nursing note and vitals reviewed. Constitutional: She is oriented to person, place, and time. She appears well-developed and well-nourished. No distress.       Very anxious-appearing, fidgety young female in nad.  HENT:  Head: Normocephalic and atraumatic.  Eyes: Conjunctivae and EOM are normal.  Cardiovascular: Normal rate and regular rhythm.   Pulmonary/Chest: Effort normal and breath sounds normal. No stridor. No respiratory distress.  Abdominal: She exhibits no distension.  Musculoskeletal: She exhibits no edema.  Neurological: She is alert and oriented to person, place, and time. No cranial nerve deficit.  Skin: Skin is warm and dry.  Psychiatric: Her behavior is normal. Judgment and thought content normal. Her mood appears anxious. Her speech is rapid and/or pressured. Cognition and memory are normal.    ED Course  Procedures (including critical care time)  Labs Reviewed - No data to display No results found.   No diagnosis found.  Cardiac 95 sinus rhythm normal Pulse oximetry 100% room air normal   Date: 11/08/2011  Rate: 98  Rhythm: normal sinus rhythm  QRS Axis: normal  Intervals: normal  ST/T Wave abnormalities: normal  Conduction Disutrbances: none  Narrative Interpretation: unremarkable      6:38 PM Sleeping MDM  This generally well young female presents with ongoing anxiety and epigastric pain.  Notably, the patient was seen here yesterday, and has not yet successfully initiated a consistent course of omeprazole.  On my initial exam patient is awake, alert, anxious.  Following Ativan, topical analgesic, the patient is sleeping.  Given the  patient's youth, her absence of risk factors, her resolution with the patient, she was discharged in stable condition with instructions to follow up with gastroenterology if her symptoms do not improve in one week.  She will also follow with primary care physician, with whom she was instructed to speak about her sleeplessness and anxiety.        Gerhard Munch, MD 11/08/11 1839  Gerhard Munch, MD 11/08/11 [phone]

## 2011-11-09 ENCOUNTER — Encounter (HOSPITAL_COMMUNITY): Payer: Self-pay | Admitting: Emergency Medicine

## 2011-11-09 ENCOUNTER — Emergency Department (HOSPITAL_COMMUNITY)
Admission: EM | Admit: 2011-11-09 | Discharge: 2011-11-10 | Disposition: A | Discharge status: Home / Self Care | Payer: Self-pay | Attending: Emergency Medicine | Admitting: Emergency Medicine

## 2011-11-09 DIAGNOSIS — R066 Hiccough: Secondary | ICD-10-CM | POA: Insufficient documentation

## 2011-11-09 DIAGNOSIS — R112 Nausea with vomiting, unspecified: Secondary | ICD-10-CM | POA: Insufficient documentation

## 2011-11-09 MED ORDER — PANTOPRAZOLE SODIUM 40 MG IV SOLR
40.0000 mg | Freq: Once | INTRAVENOUS | Status: AC
  Administered 2011-11-09: 40 mg via INTRAVENOUS
  Filled 2011-11-09: qty 40

## 2011-11-09 MED ORDER — LORAZEPAM 2 MG/ML IJ SOLN
1.0000 mg | Freq: Once | INTRAMUSCULAR | Status: AC
  Administered 2011-11-09: 1 mg via INTRAVENOUS
  Filled 2011-11-09: qty 1

## 2011-11-09 MED ORDER — SODIUM CHLORIDE 0.9 % IV BOLUS (SEPSIS)
1000.0000 mL | Freq: Once | INTRAVENOUS | Status: AC
  Administered 2011-11-09: 1000 mL via INTRAVENOUS

## 2011-11-09 NOTE — ED Provider Notes (Signed)
History     CSN: [id number]  Arrival date & time 11/09/11  2235     Chief Complaint  Patient presents with  . Gastrophageal Reflux   HPI  History provided by the patient. Patient is a 26 year old female with past history of asthma and anxiety who presents with persistent hiccuping. Patient reports being seen several times for similar complaints recently in the emergency room. She was diagnosed initially as having some acid reflux and GERD symptoms. Patient prescribed as medications has been taking this. She reports developing belching and hiccuping has been persistent and disrupting sleep. Patient came to emergency room if the symptoms and reports being given a GI cocktail and Thorazine in her arm which seemed to help temporarily with symptoms. Symptoms have since returned have been persistent. She reports occasional episodes of vomiting. She otherwise is eating normally. She has continued to take antacid medications without relief of symptoms. She denies similar symptoms previously. Patient has no other medical problems. She denies any other aggravating or alleviating factors.     Past Medical History  Diagnosis Date  . Anxiety   . Asthma     No past surgical history on file.  No family history on file.  History  Substance Use Topics  . Smoking status: Current Everyday Smoker  . Smokeless tobacco: Not on file  . Alcohol Use: No    OB History    Grav Para Term Preterm Abortions TAB SAB Ect Mult Living                  Review of Systems  Constitutional: Negative for fever and chills.  HENT: Negative for sore throat.   Respiratory: Negative for cough, shortness of breath and wheezing.   Cardiovascular: Negative for chest pain.  Gastrointestinal: Positive for nausea and vomiting. Negative for abdominal pain.  Skin: Negative for rash.    Allergies  Review of patient's allergies indicates no known allergies.  Home Medications   Current Outpatient Rx  Name Route Sig  Dispense Refill  . FAMOTIDINE 20 MG PO TABS Oral Take 20 mg by mouth 2 (two) times daily.    Marland Kitchen OMEPRAZOLE 20 MG PO CPDR Oral Take 20 mg by mouth daily.    Marland Kitchen SIMETHICONE 80 MG PO CHEW Oral Chew 80 mg by mouth every 6 (six) hours as needed. For stomach upset      BP 112/76  Pulse 97  Temp(Src) 98.2 F (36.8 C) (Oral)  Resp 18  SpO2 100%  LMP 10/11/2011  Physical Exam  Nursing note and vitals reviewed. Constitutional: She is oriented to person, place, and time. She appears well-developed and well-nourished. No distress.  HENT:  Head: Normocephalic and atraumatic.  Mouth/Throat: Oropharynx is clear and moist.  Neck: Normal range of motion. Neck supple. No tracheal deviation present. No thyromegaly present.  Cardiovascular: Normal rate and regular rhythm.   Pulmonary/Chest: Effort normal and breath sounds normal. No stridor. No respiratory distress. She has no wheezes. She has no rales.       hiccupping present.  Abdominal: Soft. She exhibits no distension. There is no tenderness.  Neurological: She is alert and oriented to person, place, and time.  Skin: Skin is warm and dry. No rash noted.  Psychiatric: She has a normal mood and affect. Her behavior is normal.    ED Course  Procedures      1. Intractable hiccups       MDM  Patient seen and evaluated. Patient no acute distress but with  hickups.   Patient with recent normal chest x-ray. Patient treated for it recently with Thorazine. Reports temporary improvement after GI cocktail. Symptoms return and have been persistent. Patient taking her antacid medications as instructed  Patient without any significant hiccups after medications. Patient has been observed and will appeared stable for several hours. At this time we'll discharge home. Patient given GI followup.   Angus Seller, [place] 11/10/11 [phone]

## 2011-11-09 NOTE — ED Notes (Signed)
EKG done at 22:41. Old and new EKG given to edp.

## 2011-11-09 NOTE — ED Notes (Addendum)
Pt reports she was treated her yesterday for GERD. Taking Prilosec and Pepcid but not carafate due to financial problems. She reports she is having hiccups and belching. She says chest hurts and she cant sleep because of it.

## 2011-11-10 NOTE — ED Notes (Signed)
Pt hiccups has decreased, pt reports feeling better at this time. Pt given meal tray and will continue to monitor pt.

## 2011-11-10 NOTE — Discharge Instructions (Signed)
Hiccups Hiccups are caused by a sudden contraction of the muscles between the ribs and the muscle under your lungs (diaphragm). When you hiccup, the top of your windpipe (glottis) closes immediately after your diaphragm contracts. This makes the typical 'hic' sound. A hiccup is a reflex that you cannot stop. Unlike other reflexes such as coughing and sneezing, hiccups do not seem to have any useful purpose. There are 3 types of hiccups:   Benign bouts: last less than 48 hours.   Persistent: last more than 48 hours but less than 1 month.   Intractable: last more than 1 month.  CAUSES  Most people have bouts of hiccups from time to time. They start for no apparent reason, last a short while, and then stop. Sometimes they are due to:  A temporary swollen stomach caused by overeating or eating too fast, eating spicy foods, drinking fizzy drinks, or swallowing air.   A sudden change in temperature (very hot or cold foods or drinks, a cold shower).   Drinking alcohol or using tobacco.  There are no particular tests used to diagnose hiccups. Hiccups are usually considered harmless and do not point to a serious medical condition. However, there can be underlying medical problems that may cause hiccups, such as pneumonia, diabetes, metabolic problems, tumors, abdominal infections or injuries, and neurologic problems.You must follow up with your caregiver if your symptoms persist or become a frequent problem. TREATMENT   Most cases need no treatment. A bout of benign hiccups usually does not last long.   Medicine is sometimes needed to stop persistent hiccups. Medicine may be given intravenously (IV) or by mouth.   Hypnosis or acupuncture may be suggested.   Surgery to affect the nerve that supplies the diaphragm may be tried in severe cases.   Treatment of an underlying cause is needed in some cases.  HOME CARE INSTRUCTIONS  Popular remedies that may stop a bout of hiccups  include:  Gargling ice water.   Swallowing granulated sugar.   Biting on a lemon.   Holding your breath, breathing fast, or breathing into a paper bag.   Bearing down.   Gasping after a sudden fright.   Pulling your tongue gently.   Distraction.  SEEK MEDICAL CARE IF:   Hiccups last for more than 48 hours.   You are given medicine but your hiccups do not get better.   New symptoms show up.   You cannot sleep or eat due to the hiccups.   You have unexpected weight loss.   You have trouble breathing or swallowing.   You develop severe pain in your abdomen or other areas.   You develop numbness, tingling, or weakness.  Document Released: 09/11/2001 Document Revised: 06/22/2011 Document Reviewed: 08/24/2010 [facility] Patient Information 2012 [place], [place].   RESOURCE GUIDE  Dental Problems  Patients with Medicaid: [facility] [phone]                                           [phone] [address] Phone:  [phone]  Phone:  [phone]  If unable to pay or uninsured, contact:  [facility] or [facility]. to become qualified for the adult dental clinic.  Chronic Pain Problems Contact [facility]  [phone] Patients need to be referred by their primary care doctor.  Insufficient Money for Medicine Contact [employer]:  call "211" or [facility] 779-886-1643.  No Primary Care Doctor Call [facility]  ([phone] Other agencies that provide inexpensive medical care    [facility]  [phone]    [facility] Internal [facility]  [phone]    [facility]  [phone]    [facility]  7252779831    [facility]  [phone]    [facility]  [phone]  Psychological Services [facility] Behavioral Health  [phone] [facility] Services  [phone] [facility] Mental Health   [phone] (emergency  services 3313570355)  Substance Abuse Resources [facility]  ([phone] [phone] [facility] 684-270-6626 [facility] [place] 260-315-7744 Floydene Flock 662-245-0885 Residential & Outpatient Substance Abuse Program  [phone]  Abuse/Neglect [facility] Child Abuse Hotline 319-093-6803 [facility] Child Abuse Hotline 808-043-6761 (After Hours)  Emergency Shelter [facility] (915)[phone]  Maternity Homes [facility] at the [place] [facility] (785)273-3346 [facility] [phone] [phone]  MRSA Hotline #:   575-159-8338    [facility] Resources  [facility] [place]     [employer]                          [facility] Dept. [address] [place]                       [address]      [address]  Blondell Reveal Phone:  [phone]                                   Phone:  [phone]                 Phone:  [phone]  [facility] Mental Health Phone:  [phone]  [facility] Child Abuse Hotline [phone] (After Hours)

## 2011-11-10 NOTE — ED Provider Notes (Signed)
Medical screening examination/treatment/procedure(s) were performed by non-physician practitioner and as supervising physician I was immediately available for consultation/collaboration.   Ramir Malerba L Thaila Bottoms, MD 11/10/11 0734 

## 2011-11-10 NOTE — ED Notes (Signed)
Pt denies any further questions, new follow up gastroenterologist given.

## 2011-11-21 ENCOUNTER — Emergency Department (HOSPITAL_COMMUNITY): Payer: Self-pay

## 2011-11-21 ENCOUNTER — Encounter (HOSPITAL_COMMUNITY): Payer: Self-pay | Admitting: *Deleted

## 2011-11-21 ENCOUNTER — Emergency Department (HOSPITAL_COMMUNITY)
Admission: EM | Admit: 2011-11-21 | Discharge: 2011-11-21 | Disposition: A | Discharge status: Home / Self Care | Payer: Self-pay | Attending: Emergency Medicine | Admitting: Emergency Medicine

## 2011-11-21 DIAGNOSIS — R0602 Shortness of breath: Secondary | ICD-10-CM | POA: Insufficient documentation

## 2011-11-21 DIAGNOSIS — F411 Generalized anxiety disorder: Secondary | ICD-10-CM | POA: Insufficient documentation

## 2011-11-21 DIAGNOSIS — K219 Gastro-esophageal reflux disease without esophagitis: Secondary | ICD-10-CM | POA: Insufficient documentation

## 2011-11-21 DIAGNOSIS — F419 Anxiety disorder, unspecified: Secondary | ICD-10-CM

## 2011-11-21 DIAGNOSIS — R10816 Epigastric abdominal tenderness: Secondary | ICD-10-CM | POA: Insufficient documentation

## 2011-11-21 DIAGNOSIS — R109 Unspecified abdominal pain: Secondary | ICD-10-CM | POA: Insufficient documentation

## 2011-11-21 LAB — DIFFERENTIAL
Eosinophils Absolute: 0.2 10*3/uL (ref 0.0–0.7)
Eosinophils Relative: 2 % (ref 0–5)
Lymphs Abs: 1.5 10*3/uL (ref 0.7–4.0)
Monocytes Absolute: 0.4 10*3/uL (ref 0.1–1.0)
Monocytes Relative: 5 % (ref 3–12)

## 2011-11-21 LAB — URINALYSIS, ROUTINE W REFLEX MICROSCOPIC
Bilirubin Urine: NEGATIVE
Hgb urine dipstick: NEGATIVE
Ketones, ur: NEGATIVE mg/dL
Nitrite: NEGATIVE
pH: 8 (ref 5.0–8.0)

## 2011-11-21 LAB — CBC
HCT: 36.4 % (ref 36.0–46.0)
Hemoglobin: 12.6 g/dL (ref 12.0–15.0)
MCH: 26.8 pg (ref 26.0–34.0)
MCV: 77.4 fL — ABNORMAL LOW (ref 78.0–100.0)
Platelets: 283 10*3/uL (ref 150–400)
RBC: 4.7 MIL/uL (ref 3.87–5.11)

## 2011-11-21 LAB — COMPREHENSIVE METABOLIC PANEL
BUN: 7 mg/dL (ref 6–23)
CO2: 26 mEq/L (ref 19–32)
Calcium: 9.3 mg/dL (ref 8.4–10.5)
Creatinine, Ser: 0.76 mg/dL (ref 0.50–1.10)
GFR calc Af Amer: >90 mL/min (ref >90)
GFR calc non Af Amer: >90 mL/min (ref >90)
Glucose, Bld: 88 mg/dL (ref 70–99)
Total Protein: 6.8 g/dL (ref 6.0–8.3)

## 2011-11-21 MED ORDER — LORAZEPAM 1 MG PO TABS: 1.0000 mg | ORAL_TABLET | Freq: Three times a day (TID) | ORAL | Status: AC

## 2011-11-21 MED ORDER — LORAZEPAM 1 MG PO TABS
1.0000 mg | ORAL_TABLET | Freq: Once | ORAL | Status: AC
  Administered 2011-11-21: 1 mg via ORAL
  Filled 2011-11-21: qty 1

## 2011-11-21 NOTE — ED Notes (Signed)
The pt has been ill for 2-3 days with chest and abd pain.  Hyperventilating at present

## 2011-11-21 NOTE — ED Provider Notes (Signed)
History     CSN: [id number]  Arrival date & time 11/21/11  1191   First MD Initiated Contact with Patient 11/21/11 2136      Chief Complaint  Patient presents with  . Abdominal Pain  . Shortness of Breath    (Consider location/radiation/quality/duration/timing/severity/associated sxs/prior treatment) HPI Comments: Patient with a history of GERD who has an appointment with Dr. Loreta Ave on the 17th presents with continued abdominal pain and bloating - she states that she is taking the carafate for this, reports continued bloating and burping, denies nausea, vomiting, fever, chills, changes in bowel habit - she states that she normally is able to handle these symptoms but that she is out of her xanax for this - she is concerned that she will not be able to handle the symptoms without the medication.  Patient is a 26 y.o. female presenting with abdominal pain and shortness of breath. The history is provided by the patient. No language interpreter was used.  Abdominal Pain The primary symptoms of the illness include abdominal pain and shortness of breath. The primary symptoms of the illness do not include fever, fatigue, nausea, vomiting, diarrhea, hematemesis, hematochezia, dysuria, vaginal discharge or vaginal bleeding. The current episode started more than 2 days ago. The onset of the illness was gradual. The problem has not changed since onset. The patient states that she believes she is currently not pregnant. The patient has not had a change in bowel habit. Symptoms associated with the illness do not include chills, anorexia, diaphoresis, heartburn, constipation, urgency, hematuria, frequency or back pain. Significant associated medical issues include GERD.  Shortness of Breath  Associated symptoms include shortness of breath. Pertinent negatives include no fever.    Past Medical History  Diagnosis Date  . Anxiety   . Asthma     History reviewed. No pertinent past surgical history.  No  family history on file.  History  Substance Use Topics  . Smoking status: Current Everyday Smoker  . Smokeless tobacco: Not on file  . Alcohol Use: No    OB History    Grav Para Term Preterm Abortions TAB SAB Ect Mult Living                  Review of Systems  Constitutional: Negative for fever, chills, diaphoresis and fatigue.  Respiratory: Positive for shortness of breath.   Gastrointestinal: Positive for abdominal pain. Negative for heartburn, nausea, vomiting, diarrhea, constipation, hematochezia, anorexia and hematemesis.  Genitourinary: Negative for dysuria, urgency, frequency, hematuria, vaginal bleeding and vaginal discharge.  Musculoskeletal: Negative for back pain.  All other systems reviewed and are negative.    Allergies  Review of patient's allergies indicates no known allergies.  Home Medications   Current Outpatient Rx  Name Route Sig Dispense Refill  . FAMOTIDINE 20 MG PO TABS Oral Take 20 mg by mouth 2 (two) times daily.    Marland Kitchen OMEPRAZOLE 20 MG PO CPDR Oral Take 20 mg by mouth daily.    Marland Kitchen SIMETHICONE 80 MG PO CHEW Oral Chew 80 mg by mouth every 6 (six) hours as needed. For stomach upset      BP 153/104  Pulse 72  Temp(Src) 97.7 F (36.5 C) (Oral)  Resp 20  SpO2 96%  LMP 11/11/2011  Physical Exam  Nursing note and vitals reviewed. Constitutional: She is oriented to person, place, and time. She appears well-developed and well-nourished.       Anxious appearing  HENT:  Head: Normocephalic and atraumatic.  Right Ear:  External ear normal.  Left Ear: External ear normal.  Nose: Nose normal.  Mouth/Throat: Oropharynx is clear and moist. No oropharyngeal exudate.  Eyes: Conjunctivae are normal. Pupils are equal, round, and reactive to light. No scleral icterus.  Neck: Normal range of motion. Neck supple.  Cardiovascular: Normal rate, regular rhythm and normal heart sounds.  Exam reveals no gallop and no friction rub.   No murmur  heard. Pulmonary/Chest: Effort normal and breath sounds normal. No respiratory distress. She has no wheezes. She has no rales. She exhibits no tenderness.  Abdominal: Soft. Bowel sounds are normal. She exhibits no distension and no mass. There is tenderness in the epigastric area. There is no rebound, no guarding and no CVA tenderness.    Musculoskeletal: Normal range of motion. She exhibits no edema and no tenderness.  Lymphadenopathy:    She has no cervical adenopathy.  Neurological: She is alert and oriented to person, place, and time. No cranial nerve deficit.  Skin: Skin is warm and dry. No rash noted. No erythema. No pallor.  Psychiatric: Thought content normal. Her mood appears anxious. Her affect is labile. Her speech is rapid and/or pressured. She is is hyperactive. She expresses impulsivity.    ED Course  Procedures (including critical care time)  Labs Reviewed  CBC - Abnormal; Notable for the following:    MCV 77.4 (*)    All other components within normal limits  URINALYSIS, ROUTINE W REFLEX MICROSCOPIC  PREGNANCY, URINE  DIFFERENTIAL  COMPREHENSIVE METABOLIC PANEL   Dg Abd Acute W/chest  11/21/2011  *RADIOLOGY REPORT*  Clinical Data: Belching and bloating.  Loss of appetite.  Shortness of breath.  ACUTE ABDOMEN SERIES (ABDOMEN 2 VIEW & CHEST 1 VIEW)  Comparison: None.  Findings: The lungs are clear without focal consolidation, edema, effusion or pneumothorax.  Cardiopericardial silhouette is within normal limits for size.  Imaged bony structures of the thorax are intact.  Upright film shows no evidence for intraperitoneal free air. Supine film shows no gaseous bowel dilatation to suggest obstruction.  No unexpected abdominopelvic calcification.  The visualized bony structures are unremarkable.  IMPRESSION: No acute cardiopulmonary process.  Normal bowel gas pattern.  Original Report Authenticated By: ERIC A. MANSELL, M.D.   Results for orders placed during the hospital  encounter of 11/21/11  URINALYSIS, ROUTINE W REFLEX MICROSCOPIC      Component Value Range   Color, Urine YELLOW  YELLOW    APPearance CLEAR  CLEAR    Specific Gravity, Urine 1.010  1.005 - 1.030    pH 8.0  5.0 - 8.0    Glucose, UA NEGATIVE  NEGATIVE (mg/dL)   Hgb urine dipstick NEGATIVE  NEGATIVE    Bilirubin Urine NEGATIVE  NEGATIVE    Ketones, ur NEGATIVE  NEGATIVE (mg/dL)   Protein, ur NEGATIVE  NEGATIVE (mg/dL)   Urobilinogen, UA 0.2  0.0 - 1.0 (mg/dL)   Nitrite NEGATIVE  NEGATIVE    Leukocytes, UA NEGATIVE  NEGATIVE   PREGNANCY, URINE      Component Value Range   Preg Test, Ur NEGATIVE  NEGATIVE   CBC      Component Value Range   WBC 6.7  4.0 - 10.5 (K/uL)   RBC 4.70  3.87 - 5.11 (MIL/uL)   Hemoglobin 12.6  12.0 - 15.0 (g/dL)   HCT 16.1  09.6 - 04.5 (%)   MCV 77.4 (*) 78.0 - 100.0 (fL)   MCH 26.8  26.0 - 34.0 (pg)   MCHC 34.6  30.0 - 36.0 (  g/dL)   RDW 16.1  09.6 - 04.5 (%)   Platelets 283  150 - 400 (K/uL)  DIFFERENTIAL      Component Value Range   Neutrophils Relative 69  43 - 77 (%)   Neutro Abs 4.6  1.7 - 7.7 (K/uL)   Lymphocytes Relative 23  12 - 46 (%)   Lymphs Abs 1.5  0.7 - 4.0 (K/uL)   Monocytes Relative 5  3 - 12 (%)   Monocytes Absolute 0.4  0.1 - 1.0 (K/uL)   Eosinophils Relative 2  0 - 5 (%)   Eosinophils Absolute 0.2  0.0 - 0.7 (K/uL)   Basophils Relative 0  0 - 1 (%)   Basophils Absolute 0.0  0.0 - 0.1 (K/uL)  COMPREHENSIVE METABOLIC PANEL      Component Value Range   Sodium 140  135 - 145 (mEq/L)   Potassium 3.7  3.5 - 5.1 (mEq/L)   Chloride 107  96 - 112 (mEq/L)   CO2 26  19 - 32 (mEq/L)   Glucose, Bld 88  70 - 99 (mg/dL)   BUN 7  6 - 23 (mg/dL)   Creatinine, Ser 4.09  0.50 - 1.10 (mg/dL)   Calcium 9.3  8.4 - 81.1 (mg/dL)   Total Protein 6.8  6.0 - 8.3 (g/dL)   Albumin 3.9  3.5 - 5.2 (g/dL)   AST 17  0 - 37 (U/L)   ALT 11  0 - 35 (U/L)   Alkaline Phosphatase 42  39 - 117 (U/L)   Total Bilirubin 0.3  0.3 - 1.2 (mg/dL)   GFR calc non Af  Amer >90  >90 (mL/min)   GFR calc Af Amer >90  >90 (mL/min)   Dg Chest 2 View  10/24/2011  *RADIOLOGY REPORT*  Clinical Data: Chest pain.  CHEST - 2 VIEW  Comparison: None.  Findings: The lungs are well-aerated and clear.  There is no evidence of focal opacification, pleural effusion or pneumothorax.  The heart is normal in size; the mediastinal contour is within normal limits.  No acute osseous abnormalities are seen.  Focal densities overlying the upper lung zones appear to be outside the patient.  IMPRESSION: No acute cardiopulmonary process seen.  Original Report Authenticated By: Tonia Ghent, M.D.   Dg Abd Acute W/chest  11/21/2011  *RADIOLOGY REPORT*  Clinical Data: Belching and bloating.  Loss of appetite.  Shortness of breath.  ACUTE ABDOMEN SERIES (ABDOMEN 2 VIEW & CHEST 1 VIEW)  Comparison: None.  Findings: The lungs are clear without focal consolidation, edema, effusion or pneumothorax.  Cardiopericardial silhouette is within normal limits for size.  Imaged bony structures of the thorax are intact.  Upright film shows no evidence for intraperitoneal free air. Supine film shows no gaseous bowel dilatation to suggest obstruction.  No unexpected abdominopelvic calcification.  The visualized bony structures are unremarkable.  IMPRESSION: No acute cardiopulmonary process.  Normal bowel gas pattern.  Original Report Authenticated By: ERIC A. MANSELL, M.D.    Date: 11/21/2011  Rate: 84  Rhythm: normal sinus rhythm  QRS Axis: normal  Intervals: normal  ST/T Wave abnormalities: normal  Conduction Disutrbances:none  Narrative Interpretation: Reviewed by Dr. Preston Fleeting  Old EKG Reviewed: unchanged     GERD Anxiety  MDM  Patient with a history of GERD presents with symptoms similar to this - there is no indication of a surgical emergency based on labs, x-ray and exam findings.  Patient with rapid and pressured speech and I believe this to be  the true component to her appearance to the ER -  reports feeling better after medication.        Izola Price [place], [place] 11/21/11 2301  Izola Price [place], [place] 11/21/11 2304

## 2011-11-21 NOTE — ED Notes (Signed)
She has anxiety attacks and she has been out of her meds since yesterday.  She has constant burping and swallowing air now with hiccoughs.  abd pain

## 2011-11-21 NOTE — Discharge Instructions (Signed)
Anxiety and Panic Attacks Your caregiver has informed you that you are having an anxiety or panic attack. There may be many forms of this. Most of the time these attacks come suddenly and without warning. They come at any time of day, including periods of sleep, and at any time of life. They may be strong and unexplained. Although panic attacks are very scary, they are physically harmless. Sometimes the cause of your anxiety is not known. Anxiety is a protective mechanism of the body in its fight or flight mechanism. Most of these perceived danger situations are actually nonphysical situations (such as anxiety over losing a job). CAUSES  The causes of an anxiety or panic attack are many. Panic attacks may occur in otherwise healthy people given a certain set of circumstances. There may be a genetic cause for panic attacks. Some medications may also have anxiety as a side effect. SYMPTOMS  Some of the most common feelings are:  Intense terror.   Dizziness, feeling faint.   Hot and cold flashes.   Fear of going crazy.   Feelings that nothing is real.   Sweating.   Shaking.   Chest pain or a fast heartbeat (palpitations).   Smothering, choking sensations.   Feelings of impending doom and that death is near.   Tingling of extremities, this may be from over-breathing.   Altered reality (derealization).   Being detached from yourself (depersonalization).  Several symptoms can be present to make up anxiety or panic attacks. DIAGNOSIS  The evaluation by your caregiver will depend on the type of symptoms you are experiencing. The diagnosis of anxiety or panic attack is made when no physical illness can be determined to be a cause of the symptoms. TREATMENT  Treatment to prevent anxiety and panic attacks may include:  Avoidance of circumstances that cause anxiety.   Reassurance and relaxation.   Regular exercise.   Relaxation therapies, such as yoga.   Psychotherapy with a  psychiatrist or therapist.   Avoidance of caffeine, alcohol and illegal drugs.   Prescribed medication.  SEEK IMMEDIATE MEDICAL CARE IF:   You experience panic attack symptoms that are different than your usual symptoms.   You have any worsening or concerning symptoms.  Document Released: 07/03/2005 Document Revised: 06/22/2011 Document Reviewed: 11/04/2009 ExitCare Patient Information 2012 [employer].Gastroesophageal Reflux Disease, Adult Gastroesophageal reflux disease (GERD) happens when acid from your stomach flows up into the esophagus. When acid comes in contact with the esophagus, the acid causes soreness (inflammation) in the esophagus. Over time, GERD may create small holes (ulcers) in the lining of the esophagus. CAUSES   Increased body weight. This puts pressure on the stomach, making acid rise from the stomach into the esophagus.   Smoking. This increases acid production in the stomach.   Drinking alcohol. This causes decreased pressure in the lower esophageal sphincter (valve or ring of muscle between the esophagus and stomach), allowing acid from the stomach into the esophagus.   Late evening meals and a full stomach. This increases pressure and acid production in the stomach.   A malformed lower esophageal sphincter.  Sometimes, no cause is found. SYMPTOMS   Burning pain in the lower part of the mid-chest behind the breastbone and in the mid-stomach area. This may occur twice a week or more often.   Trouble swallowing.   Sore throat.   Dry cough.   Asthma-like symptoms including chest tightness, shortness of breath, or wheezing.  DIAGNOSIS  Your caregiver may be able to   diagnose GERD based on your symptoms. In some cases, X-rays and other tests may be done to check for complications or to check the condition of your stomach and esophagus. TREATMENT  Your caregiver may recommend over-the-counter or prescription medicines to help decrease acid production. Ask  your caregiver before starting or adding any new medicines.  HOME CARE INSTRUCTIONS   Change the factors that you can control. Ask your caregiver for guidance concerning weight loss, quitting smoking, and alcohol consumption.   Avoid foods and drinks that make your symptoms worse, such as:   Caffeine or alcoholic drinks.   Chocolate.   Peppermint or mint flavorings.   Garlic and onions.   Spicy foods.   Citrus fruits, such as oranges, lemons, or limes.   Tomato-based foods such as sauce, chili, salsa, and pizza.   Fried and fatty foods.   Avoid lying down for the 3 hours prior to your bedtime or prior to taking a nap.   Eat small, frequent meals instead of large meals.   Wear loose-fitting clothing. Do not wear anything tight around your waist that causes pressure on your stomach.   Raise the head of your bed 6 to 8 inches with wood blocks to help you sleep. Extra pillows will not help.   Only take over-the-counter or prescription medicines for pain, discomfort, or fever as directed by your caregiver.   Do not take aspirin, ibuprofen, or other nonsteroidal anti-inflammatory drugs (NSAIDs).  SEEK IMMEDIATE MEDICAL CARE IF:   You have pain in your arms, neck, jaw, teeth, or back.   Your pain increases or changes in intensity or duration.   You develop nausea, vomiting, or sweating (diaphoresis).   You develop shortness of breath, or you faint.   Your vomit is green, yellow, black, or looks like coffee grounds or blood.   Your stool is red, bloody, or black.  These symptoms could be signs of other problems, such as heart disease, gastric bleeding, or esophageal bleeding. MAKE SURE YOU:   Understand these instructions.   Will watch your condition.   Will get help right away if you are not doing well or get worse.  Document Released: 04/12/2005 Document Revised: 06/22/2011 Document Reviewed: 01/20/2011 ExitCare Patient Information 2012 [employer]. 

## 2011-11-22 NOTE — ED Provider Notes (Signed)
Medical screening examination/treatment/procedure(s) were performed by non-physician practitioner and as supervising physician I was immediately available for consultation/collaboration.  Gerhard Munch, MD 11/22/11 [phone]

## 2011-12-07 ENCOUNTER — Inpatient Hospital Stay (HOSPITAL_COMMUNITY)
Admission: AD | Admit: 2011-12-07 | Discharge: 2011-12-07 | Disposition: A | Discharge status: Home / Self Care | Payer: Self-pay | Source: Ambulatory Visit | Attending: Obstetrics & Gynecology | Admitting: Obstetrics & Gynecology

## 2011-12-07 ENCOUNTER — Encounter (HOSPITAL_COMMUNITY): Payer: Self-pay | Admitting: *Deleted

## 2011-12-07 DIAGNOSIS — F411 Generalized anxiety disorder: Secondary | ICD-10-CM | POA: Insufficient documentation

## 2011-12-07 DIAGNOSIS — G4700 Insomnia, unspecified: Secondary | ICD-10-CM | POA: Insufficient documentation

## 2011-12-07 DIAGNOSIS — K219 Gastro-esophageal reflux disease without esophagitis: Secondary | ICD-10-CM | POA: Insufficient documentation

## 2011-12-07 DIAGNOSIS — F419 Anxiety disorder, unspecified: Secondary | ICD-10-CM

## 2011-12-07 DIAGNOSIS — F064 Anxiety disorder due to known physiological condition: Secondary | ICD-10-CM

## 2011-12-07 HISTORY — DX: Gastro-esophageal reflux disease without esophagitis: K21.9

## 2011-12-07 MED ORDER — LORAZEPAM 1 MG PO TABS
1.0000 mg | ORAL_TABLET | ORAL | Status: AC
  Administered 2011-12-07: 1 mg via ORAL
  Filled 2011-12-07: qty 1

## 2011-12-07 MED ORDER — LORAZEPAM 1 MG PO TABS: 1.0000 mg | ORAL_TABLET | Freq: Three times a day (TID) | ORAL | Status: DC

## 2011-12-07 NOTE — MAU Provider Note (Signed)
History     CSN: [id number]  Arrival date and time: 12/07/11 4098   First Provider Initiated Contact with Patient 12/07/11 [phone]      Chief Complaint  Patient presents with  . Anxiety   HPI Pt presents to MAU with anxiety and insomnia r/t GERD and diagnosed ulcer.  She is belching frequently and has hiccups intermittently while in MAU. She reports that she saw a GI specialist (Dr Loreta Ave) for her GI symptoms and is working on managing those with several medications but she is unable to sleep with frequent belching and hiccups.  She was prescribed Ativan on 11/22/11 by Wylene Simmer, PA, but is out of her medication tonight.   She tried to sleep without Ativan but is having difficulty so she came in to the hospital to be seen.  She denies vaginal bleeding, vaginal itching/burning, urinary symptoms, h/a, dizziness, n/v, or fever/chills.   She has her GI medications with prescription information and her empty Ativan bottle with dates and prescription information with her in the MAU room and shows these to the provider.   OB History    Grav Para Term Preterm Abortions TAB SAB Ect Mult Living                  Past Medical History  Diagnosis Date  . Anxiety   . Asthma   . GERD (gastroesophageal reflux disease)     Past Surgical History  Procedure Date  . No past surgeries     History reviewed. No pertinent family history.  History  Substance Use Topics  . Smoking status: Current Everyday Smoker  . Smokeless tobacco: Not on file  . Alcohol Use: No    Allergies: No Known Allergies  Prescriptions prior to admission  Medication Sig Dispense Refill  . famotidine (PEPCID) 20 MG tablet Take 20 mg by mouth 2 (two) times daily.      Marland Kitchen omeprazole (PRILOSEC) 20 MG capsule Take 20 mg by mouth daily.      . simethicone (MYLICON) 80 MG chewable tablet Chew 80 mg by mouth every 6 (six) hours as needed. For stomach upset        Review of Systems  Constitutional: Negative for fever,  chills and malaise/fatigue.  Eyes: Negative for blurred vision.  Respiratory: Negative for cough and shortness of breath.   Cardiovascular: Negative for chest pain.  Gastrointestinal: Positive for heartburn. Negative for nausea and vomiting.  Genitourinary: Negative for dysuria, urgency and frequency.  Musculoskeletal: Negative.   Neurological: Negative for dizziness and headaches.  Psychiatric/Behavioral: Negative for depression and suicidal ideas. The patient is nervous/anxious and has insomnia.    Physical Exam   Blood pressure 138/81, pulse 107, temperature 97.6 F (36.4 C), temperature source Oral, resp. rate 18, height 5\' 5"  (1.651 m), weight 56.246 kg (124 lb), last menstrual period 11/11/2011.  Physical Exam  Nursing note and vitals reviewed. Constitutional: She is oriented to person, place, and time. She appears well-developed and well-nourished. She appears distressed.  Neck: Normal range of motion.  Cardiovascular: Normal rate, regular rhythm and normal heart sounds.   Respiratory: Effort normal and breath sounds normal.  GI: Soft. Bowel sounds are normal. There is no tenderness. There is no rebound and no guarding.  Musculoskeletal: Normal range of motion.  Neurological: She is alert and oriented to person, place, and time.  Skin: Skin is warm and dry.  Psychiatric: She has a normal mood and affect. Her behavior is normal. Judgment and thought content normal.  Pt belching frequently, has hiccups off and on during MAU visit.    MAU Course  Procedures   Assessment and Plan  GERD Anxiety Insomnia  Ativan 1 mg PO x1 dose in MAU  D/C home Ativan 1 mg PO Q 8 hours x15 tabs F/U with GI specialist or primary care for anxiety management  Return to MAU as needed  LEFTWICH-KIRBY, Maudry Zeidan 12/07/2011, 4:42 AM

## 2011-12-07 NOTE — Discharge Instructions (Signed)
Anxiety and Panic Attacks Your caregiver has informed you that you are having an anxiety or panic attack. There may be many forms of this. Most of the time these attacks come suddenly and without warning. They come at any time of day, including periods of sleep, and at any time of life. They may be strong and unexplained. Although panic attacks are very scary, they are physically harmless. Sometimes the cause of your anxiety is not known. Anxiety is a protective mechanism of the body in its fight or flight mechanism. Most of these perceived danger situations are actually nonphysical situations (such as anxiety over losing a job). CAUSES  The causes of an anxiety or panic attack are many. Panic attacks may occur in otherwise healthy people given a certain set of circumstances. There may be a genetic cause for panic attacks. Some medications may also have anxiety as a side effect. SYMPTOMS  Some of the most common feelings are:  Intense terror.   Dizziness, feeling faint.   Hot and cold flashes.   Fear of going crazy.   Feelings that nothing is real.   Sweating.   Shaking.   Chest pain or a fast heartbeat (palpitations).   Smothering, choking sensations.   Feelings of impending doom and that death is near.   Tingling of extremities, this may be from over-breathing.   Altered reality (derealization).   Being detached from yourself (depersonalization).  Several symptoms can be present to make up anxiety or panic attacks. DIAGNOSIS  The evaluation by your caregiver will depend on the type of symptoms you are experiencing. The diagnosis of anxiety or panic attack is made when no physical illness can be determined to be a cause of the symptoms. TREATMENT  Treatment to prevent anxiety and panic attacks may include:  Avoidance of circumstances that cause anxiety.   Reassurance and relaxation.   Regular exercise.   Relaxation therapies, such as yoga.   Psychotherapy with a  psychiatrist or therapist.   Avoidance of caffeine, alcohol and illegal drugs.   Prescribed medication.  SEEK IMMEDIATE MEDICAL CARE IF:   You experience panic attack symptoms that are different than your usual symptoms.   You have any worsening or concerning symptoms.  Document Released: 07/03/2005 Document Revised: 06/22/2011 Document Reviewed: 11/04/2009 ExitCare Patient Information 2012 [employer].Gastroesophageal Reflux Disease, Adult Gastroesophageal reflux disease (GERD) happens when acid from your stomach flows up into the esophagus. When acid comes in contact with the esophagus, the acid causes soreness (inflammation) in the esophagus. Over time, GERD may create small holes (ulcers) in the lining of the esophagus. CAUSES   Increased body weight. This puts pressure on the stomach, making acid rise from the stomach into the esophagus.   Smoking. This increases acid production in the stomach.   Drinking alcohol. This causes decreased pressure in the lower esophageal sphincter (valve or ring of muscle between the esophagus and stomach), allowing acid from the stomach into the esophagus.   Late evening meals and a full stomach. This increases pressure and acid production in the stomach.   A malformed lower esophageal sphincter.  Sometimes, no cause is found. SYMPTOMS   Burning pain in the lower part of the mid-chest behind the breastbone and in the mid-stomach area. This may occur twice a week or more often.   Trouble swallowing.   Sore throat.   Dry cough.   Asthma-like symptoms including chest tightness, shortness of breath, or wheezing.  DIAGNOSIS  Your caregiver may be able to   diagnose GERD based on your symptoms. In some cases, X-rays and other tests may be done to check for complications or to check the condition of your stomach and esophagus. TREATMENT  Your caregiver may recommend over-the-counter or prescription medicines to help decrease acid production. Ask  your caregiver before starting or adding any new medicines.  HOME CARE INSTRUCTIONS   Change the factors that you can control. Ask your caregiver for guidance concerning weight loss, quitting smoking, and alcohol consumption.   Avoid foods and drinks that make your symptoms worse, such as:   Caffeine or alcoholic drinks.   Chocolate.   Peppermint or mint flavorings.   Garlic and onions.   Spicy foods.   Citrus fruits, such as oranges, lemons, or limes.   Tomato-based foods such as sauce, chili, salsa, and pizza.   Fried and fatty foods.   Avoid lying down for the 3 hours prior to your bedtime or prior to taking a nap.   Eat small, frequent meals instead of large meals.   Wear loose-fitting clothing. Do not wear anything tight around your waist that causes pressure on your stomach.   Raise the head of your bed 6 to 8 inches with wood blocks to help you sleep. Extra pillows will not help.   Only take over-the-counter or prescription medicines for pain, discomfort, or fever as directed by your caregiver.   Do not take aspirin, ibuprofen, or other nonsteroidal anti-inflammatory drugs (NSAIDs).  SEEK IMMEDIATE MEDICAL CARE IF:   You have pain in your arms, neck, jaw, teeth, or back.   Your pain increases or changes in intensity or duration.   You develop nausea, vomiting, or sweating (diaphoresis).   You develop shortness of breath, or you faint.   Your vomit is green, yellow, black, or looks like coffee grounds or blood.   Your stool is red, bloody, or black.  These symptoms could be signs of other problems, such as heart disease, gastric bleeding, or esophageal bleeding. MAKE SURE YOU:   Understand these instructions.   Will watch your condition.   Will get help right away if you are not doing well or get worse.  Document Released: 04/12/2005 Document Revised: 06/22/2011 Document Reviewed: 01/20/2011 ExitCare Patient Information 2012 [employer]. 

## 2011-12-07 NOTE — MAU Note (Signed)
Patient states she is an anxiety problems from uncontrolled GERD. Ran out of anxiety meds

## 2011-12-11 ENCOUNTER — Emergency Department (HOSPITAL_COMMUNITY)
Admission: EM | Admit: 2011-12-11 | Discharge: 2011-12-12 | Disposition: A | Discharge status: Home / Self Care | Payer: Self-pay | Attending: Emergency Medicine | Admitting: Emergency Medicine

## 2011-12-11 ENCOUNTER — Encounter (HOSPITAL_COMMUNITY): Payer: Self-pay | Admitting: Emergency Medicine

## 2011-12-11 DIAGNOSIS — F17200 Nicotine dependence, unspecified, uncomplicated: Secondary | ICD-10-CM | POA: Insufficient documentation

## 2011-12-11 DIAGNOSIS — K219 Gastro-esophageal reflux disease without esophagitis: Secondary | ICD-10-CM | POA: Insufficient documentation

## 2011-12-11 DIAGNOSIS — J45909 Unspecified asthma, uncomplicated: Secondary | ICD-10-CM | POA: Insufficient documentation

## 2011-12-11 DIAGNOSIS — R110 Nausea: Secondary | ICD-10-CM | POA: Insufficient documentation

## 2011-12-11 DIAGNOSIS — R1031 Right lower quadrant pain: Secondary | ICD-10-CM | POA: Insufficient documentation

## 2011-12-11 DIAGNOSIS — K921 Melena: Secondary | ICD-10-CM | POA: Insufficient documentation

## 2011-12-11 DIAGNOSIS — R141 Gas pain: Secondary | ICD-10-CM | POA: Insufficient documentation

## 2011-12-11 DIAGNOSIS — R142 Eructation: Secondary | ICD-10-CM | POA: Insufficient documentation

## 2011-12-11 DIAGNOSIS — F411 Generalized anxiety disorder: Secondary | ICD-10-CM | POA: Insufficient documentation

## 2011-12-11 NOTE — ED Notes (Signed)
Patient complaining of abdominal pain that started about an hour ago; patient states that she has been diagnosed with a stomach ulcer, has seen a specialist, and has been taking her prescribed medication.  Patient denies nausea, vomiting, and diarrhea.

## 2011-12-12 ENCOUNTER — Emergency Department (HOSPITAL_COMMUNITY): Payer: Self-pay

## 2011-12-12 LAB — URINALYSIS, ROUTINE W REFLEX MICROSCOPIC
Leukocytes, UA: NEGATIVE
Nitrite: NEGATIVE
Specific Gravity, Urine: 1.016 (ref 1.005–1.030)
Urobilinogen, UA: 0.2 mg/dL (ref 0.0–1.0)
pH: 7 (ref 5.0–8.0)

## 2011-12-12 LAB — DIFFERENTIAL
Basophils Relative: 0 % (ref 0–1)
Eosinophils Absolute: 0.3 10*3/uL (ref 0.0–0.7)
Lymphs Abs: 2.8 10*3/uL (ref 0.7–4.0)
Monocytes Relative: 7 % (ref 3–12)
Neutro Abs: 6 10*3/uL (ref 1.7–7.7)
Neutrophils Relative %: 62 % (ref 43–77)

## 2011-12-12 LAB — POCT I-STAT, CHEM 8
Creatinine, Ser: 0.8 mg/dL (ref 0.50–1.10)
Hemoglobin: 13.3 g/dL (ref 12.0–15.0)
Potassium: 3.9 mEq/L (ref 3.5–5.1)
Sodium: 139 mEq/L (ref 135–145)
TCO2: 25 mmol/L (ref 0–100)

## 2011-12-12 LAB — CBC
Hemoglobin: 12.5 g/dL (ref 12.0–15.0)
MCHC: 34.7 g/dL (ref 30.0–36.0)
Platelets: 250 10*3/uL (ref 150–400)
RBC: 4.74 MIL/uL (ref 3.87–5.11)

## 2011-12-12 LAB — POCT PREGNANCY, URINE: Preg Test, Ur: NEGATIVE

## 2011-12-12 MED ORDER — IOHEXOL 300 MG/ML  SOLN
100.0000 mL | Freq: Once | INTRAMUSCULAR | Status: AC | PRN
  Administered 2011-12-12: 100 mL via INTRAVENOUS

## 2011-12-12 MED ORDER — MORPHINE SULFATE 4 MG/ML IJ SOLN
2.0000 mg | Freq: Once | INTRAMUSCULAR | Status: AC
  Administered 2011-12-12: 2 mg via INTRAVENOUS
  Filled 2011-12-12 (×2): qty 1

## 2011-12-12 MED ORDER — IOHEXOL 300 MG/ML  SOLN
20.0000 mL | INTRAMUSCULAR | Status: AC
  Administered 2011-12-12: 20 mL via ORAL

## 2011-12-12 MED ORDER — MORPHINE SULFATE 2 MG/ML IJ SOLN: 2.0000 mg | Freq: Once | INTRAMUSCULAR | Status: DC

## 2011-12-12 MED ORDER — LORAZEPAM 1 MG PO TABS: 1.0000 mg | ORAL_TABLET | Freq: Three times a day (TID) | ORAL | Status: DC | PRN

## 2011-12-12 MED ORDER — SODIUM CHLORIDE 0.9 % IV BOLUS (SEPSIS)
1000.0000 mL | Freq: Once | INTRAVENOUS | Status: AC
  Administered 2011-12-12: 1000 mL via INTRAVENOUS

## 2011-12-12 NOTE — Discharge Instructions (Signed)
You have been diagnosed with undifferentiated abdominal pain.  Abdominal pain can be caused by many things. Your caregiver evaluates the seriousness of your pain by an examination and possibly blood or urine tests and imaging (CT scan, x-rays, ultrasound). Many cases can be observed and treated at home after initial evaluation in the emergency department. Even though you are being discharged home, abdominal pain can be unpredictable. Therefore, you need a repeat exam if your pain does not resolve, returns, or worsens. Most patient's with abdominal pain do not need to be admitted to the hospital or have surgery, but serious problems like appendicitis and gallbladder attacks can start out as nonspecific pain. Many abdominal conditions cannot be diagnosed in 1 visit, so followup evaluations are very important.  Seek immediate medical attention if:  *The pain does not go away or becomes severe. *Temperature above 101 develops *Repeated vomiting occurs(multiple episodes) *The pain becomes localized to portions of the abdomen. The right side could possibly be appendicitis. In an adult, the left lower portion of the abdomen could be colitis or diverticulitis. *Blood is being passed in stools or vomit *Return also if you develop chest pain, difficulty breathing, dizziness or fainting, or become confused poorly responsive or inconsolable (young children).     If you do not have a physician, you should reference the below phone numbers and call in the morning to establish follow up care.  RESOURCE GUIDE  Dental Problems  Patients with Medicaid: [facility]                     [facility] [address]                                           [address] Phone:  [phone]                                                  Phone:  [phone]  If unable to pay or uninsured, contact:  [facility] or [facility] to become qualified for the adult dental  clinic.  Chronic Pain Problems Contact [facility]  [phone] Patients need to be referred by their primary care doctor.  Insufficient Money for Medicine Contact [employer]:  call "211" or [facility] 271-5999.  No Primary Care Doctor Call [facility]  832-8000 Other agencies that provide inexpensive medical care    [facility]  [phone]    [facility]  [phone]    [facility]  [phone]    Women[facility] Clinic  [phone]    [facility]  [phone]    [facility]  [phone]  Psychological Services [facility]  [phone] [facility]  [phone] [facility]   800 853-5163 (emergency services [phone])  Substance Abuse Resources [facility]  336-882-2125 [facility] 336-784-9470 [facility] 336-285-9073 [facility] 336-845-3988 Residential & Outpatient Substance Abuse Program  800-659-3381  Abuse/Neglect [place] Child Abuse Hotline (336) 641-3795 [place] Child Abuse Hotline 800-378-5315 (After Hours)  Emergency Shelter [facility] (336) 271-5985  [facility] (336) 275-9566 [facility]   Services (704) 372-4663  MRSA Hotline #:   832-7006    [place] Resources  [facility] [place]     [employer]                          [facility] [address] [place]                       [address]      [address]  [place]                                                [place]                            [place] Phone:  [phone]                                   Phone:  [phone]                 Phone:  [phone]  [facility] Phone:  [phone]  [place] Child Abuse Hotline (336) 342-1394 (336) 342-3537 (After Hours)    

## 2011-12-12 NOTE — ED Notes (Signed)
rx x 1, pt voiced understanding to f/u with PCP and GI

## 2011-12-12 NOTE — ED Notes (Signed)
CT notified that pt finished drinking, will come get soon

## 2011-12-12 NOTE — ED Provider Notes (Signed)
History     CSN: [id number]  Arrival date & time 12/11/11  2242   First MD Initiated Contact with Patient 12/11/11 2329      Chief Complaint  Patient presents with  . Abdominal Pain    (Consider location/radiation/quality/duration/timing/severity/associated sxs/prior treatment) HPI Comments: 26 year old female with a history of acid reflux and peptic ulcer disease per her report who presents with a complaint of right lower quadrant abdominal pain associated with bloating. She states that she's been worked up by her family Dr. has been referred to gastroenterology, placed on a probiotic and antianxiety medications. She has noticed it since starting his regimen she has noted increased bloating and some blood in her stools but since 8:00 this evening she noted gradual onset of progressively worsening right lower quadrant pain. There is no associated vomiting but she does have some nausea. She denies any headaches coughing shortness of breath back pain dysuria or diarrhea. She does have mild blood in her stools which has been going on for the last couple of weeks. The symptoms are currently moderate and persistent and worse with palpation he denies a history of appendicitis or other abdominal surgeries and states this does not feel like her menstrual cramps.  Patient is a 26 y.o. female presenting with abdominal pain. The history is provided by the patient and medical records.  Abdominal Pain The primary symptoms of the illness include abdominal pain.    Past Medical History  Diagnosis Date  . Anxiety   . Asthma   . GERD (gastroesophageal reflux disease)     Past Surgical History  Procedure Date  . No past surgeries     History reviewed. No pertinent family history.  History  Substance Use Topics  . Smoking status: Current Everyday Smoker  . Smokeless tobacco: Not on file  . Alcohol Use: No    OB History    Grav Para Term Preterm Abortions TAB SAB Ect Mult Living        Review of Systems  Gastrointestinal: Positive for abdominal pain.  All other systems reviewed and are negative.    Allergies  Review of patient's allergies indicates no known allergies.  Home Medications   Current Outpatient Rx  Name Route Sig Dispense Refill  . CALCIUM CARBONATE ANTACID 750 MG PO CHEW Oral Chew 1 tablet by mouth 3 (three) times daily.    Marland Kitchen LORAZEPAM 1 MG PO TABS Oral Take 1 tablet (1 mg total) by mouth every 8 (eight) hours. As needed for anxiety 15 tablet 0  . ALIGN PO Oral Take 1 capsule by mouth every 12 (twelve) hours.    Marland Kitchen SIMETHICONE 80 MG PO CHEW Oral Chew 80 mg by mouth every 6 (six) hours as needed. For stomach upset    . LORAZEPAM 1 MG PO TABS Oral Take 1 tablet (1 mg total) by mouth 3 (three) times daily as needed for anxiety. 7 tablet 0  . RIFAXIMIN 550 MG PO TABS Oral Take 550 mg by mouth every 12 (twelve) hours.       BP 98/66  Pulse 82  Temp(Src) 97.7 F (36.5 C) (Oral)  Resp 16  SpO2 100%  LMP 11/11/2011  Physical Exam  Nursing note and vitals reviewed. Constitutional: She appears well-developed and well-nourished. No distress.  HENT:  Head: Normocephalic and atraumatic.  Mouth/Throat: Oropharynx is clear and moist. No oropharyngeal exudate.  Eyes: Conjunctivae and EOM are normal. Pupils are equal, round, and reactive to light. Right eye exhibits no discharge. Left eye  exhibits no discharge. No scleral icterus.  Neck: Normal range of motion. Neck supple. No JVD present. No thyromegaly present.  Cardiovascular: Normal rate, regular rhythm, normal heart sounds and intact distal pulses.  Exam reveals no gallop and no friction rub.   No murmur heard. Pulmonary/Chest: Effort normal and breath sounds normal. No respiratory distress. She has no wheezes. She has no rales.  Abdominal: Soft. Bowel sounds are normal. She exhibits no distension and no mass. There is tenderness ( Lower quadrant tenderness with mild guarding, no tympanitic sounds  to percussion, normal bowel sounds, no pain in the left lower quadrant suprapubic area or epigastrium, non-peritoneal).  Musculoskeletal: Normal range of motion. She exhibits no edema and no tenderness.  Lymphadenopathy:    She has no cervical adenopathy.  Neurological: She is alert. Coordination normal.  Skin: Skin is warm and dry. No rash noted. No erythema.  Psychiatric: She has a normal mood and affect. Her behavior is normal.    ED Course  Procedures (including critical care time)  Labs Reviewed  CBC - Abnormal; Notable for the following:    MCV 75.9 (*)    All other components within normal limits  URINALYSIS, ROUTINE W REFLEX MICROSCOPIC  DIFFERENTIAL  POCT PREGNANCY, URINE  POCT I-STAT, CHEM 8   Ct Abdomen Pelvis W Contrast  12/12/2011  *RADIOLOGY REPORT*  Clinical Data: Right lower quadrant abdominal pain.  History of gastroesophageal reflux disease and stomach ulcers.  CT ABDOMEN AND PELVIS WITH CONTRAST  Technique:  Multidetector CT imaging of the abdomen and pelvis was performed following the standard protocol during bolus administration of intravenous contrast.  Contrast: OMNIPAQUE IOHEXOL 300 MG/ML  SOLN  Comparison: None.  Findings: Slight fibrosis in the lung bases.  And  Are normal the liver, spleen, and gallbladder, and pancreas, and 88 adrenal glands, kidneys, abdominal aorta, kidneys and retroperitoneal lymph nodes are unremarkable.  The gastric wall is not thickened.  Small bowel are not dilated.  Stool filled colon without distension.  No free air or free fluid in the abdomen.  Pelvis:  The uterus and adnexal structures are not enlarged.  No focal fluid collection in the low pelvis may represent an ovarian cyst or physiologic fluid.  The bladder wall is thickened which might represent a bladder infection or hypertrophy.  No significant lymphadenopathy in the pelvis.  The appendix is normal.  Normal alignment of the lumbar vertebrae.  IMPRESSION: Thickening of the  bladder wall suggesting bladder infection versus hypertrophy.  Fluid collection in the posterior pelvis likely representing a left ovarian cyst versus physiologic fluid.  The appendix is normal.  Original Report Authenticated By: Marlon Pel, M.D.     1. Abdominal pain, right lower quadrant       MDM  Overall the patient is well-appearing with vital signs that do not reflect any significant abnormalities other than mild hypotension. Her tenderness is in the right lower quadrant alone, will rule out appendicitis with a CT scan otherwise patient can likely followup for ongoing gastrointestinal abnormalities. Would also consider ulcerative colitis or Crohn's disease in the patient's differential diagnosis  Patient reevaluated, she has no abdominal tenderness, CT scan reviewed with the patient, she is in understanding that she does not have any signs of appendicitis, she knows that her blood counts are normal and her urinalysis is clean of infection. She has been able to tolerate fluids without difficulty and has a GI followup with Dr. Loreta Ave, family doctor followup today. She has requested a  refill of Ativan and she states this this does seem to make her feel better. I will give her 7 tablets  Discharge Prescriptions include:  Ativan      Vida Roller, MD 12/12/11 0430

## 2011-12-15 ENCOUNTER — Ambulatory Visit: Payer: Self-pay

## 2011-12-19 ENCOUNTER — Encounter (HOSPITAL_COMMUNITY): Payer: Self-pay | Admitting: *Deleted

## 2011-12-19 ENCOUNTER — Emergency Department (HOSPITAL_COMMUNITY): Payer: Self-pay

## 2011-12-19 ENCOUNTER — Other Ambulatory Visit: Payer: Self-pay

## 2011-12-19 ENCOUNTER — Emergency Department (HOSPITAL_COMMUNITY)
Admission: EM | Admit: 2011-12-19 | Discharge: 2011-12-20 | Disposition: A | Discharge status: Home / Self Care | Payer: Self-pay | Attending: Emergency Medicine | Admitting: Emergency Medicine

## 2011-12-19 DIAGNOSIS — K219 Gastro-esophageal reflux disease without esophagitis: Secondary | ICD-10-CM | POA: Insufficient documentation

## 2011-12-19 DIAGNOSIS — R42 Dizziness and giddiness: Secondary | ICD-10-CM | POA: Insufficient documentation

## 2011-12-19 DIAGNOSIS — R0602 Shortness of breath: Secondary | ICD-10-CM | POA: Insufficient documentation

## 2011-12-19 DIAGNOSIS — R079 Chest pain, unspecified: Secondary | ICD-10-CM | POA: Insufficient documentation

## 2011-12-19 DIAGNOSIS — F419 Anxiety disorder, unspecified: Secondary | ICD-10-CM

## 2011-12-19 DIAGNOSIS — R209 Unspecified disturbances of skin sensation: Secondary | ICD-10-CM | POA: Insufficient documentation

## 2011-12-19 HISTORY — DX: Gastric ulcer, unspecified as acute or chronic, without hemorrhage or perforation: K25.9

## 2011-12-19 LAB — BASIC METABOLIC PANEL
BUN: 7 mg/dL (ref 6–23)
Chloride: 105 mEq/L (ref 96–112)
GFR calc non Af Amer: >90 mL/min (ref >90)
Glucose, Bld: 88 mg/dL (ref 70–99)
Potassium: 3.3 mEq/L — ABNORMAL LOW (ref 3.5–5.1)
Sodium: 137 mEq/L (ref 135–145)

## 2011-12-19 LAB — CBC
HCT: 37.1 % (ref 36.0–46.0)
Hemoglobin: 13 g/dL (ref 12.0–15.0)
RBC: 4.92 MIL/uL (ref 3.87–5.11)
WBC: 7 10*3/uL (ref 4.0–10.5)

## 2011-12-19 MED ORDER — ONDANSETRON 4 MG PO TBDP
8.0000 mg | ORAL_TABLET | Freq: Once | ORAL | Status: AC
  Administered 2011-12-19: 8 mg via ORAL
  Filled 2011-12-19: qty 2

## 2011-12-19 MED ORDER — LORAZEPAM 1 MG PO TABS
1.0000 mg | ORAL_TABLET | Freq: Once | ORAL | Status: AC
  Administered 2011-12-19: 1 mg via ORAL
  Filled 2011-12-19: qty 1

## 2011-12-19 NOTE — ED Notes (Signed)
Pt. C/o belching and nausea and GERD. Denies vomiting but states she can not eat. States symptoms started around March and was diagnosed with a stomach ulcer in April, has appoint for this on June 7th.  States that due to the ulcer problems and her recent cold she gets herself "worked up to the point where I feel like I can't breath and then my whole left side goes numb."  Sensation intact bilaterally. Mild weakness on left side with hand grip and pushing with her feet. Alert and oriented x4.

## 2011-12-19 NOTE — ED Notes (Signed)
MD at bedside. 

## 2011-12-19 NOTE — ED Notes (Signed)
Repeat EKg done

## 2011-12-19 NOTE — ED Notes (Signed)
Pt c/o belching, coughing, dizziness and lightnededness with nausea that began around 4 pm today. Pt states she has also had a cold

## 2011-12-19 NOTE — Discharge Instructions (Signed)

## 2011-12-19 NOTE — ED Notes (Signed)
Pt complaining of pain in rt lower side pinching.  Removed EKG sticker.  Pt stated she felt much better.

## 2011-12-19 NOTE — ED Provider Notes (Signed)
History     CSN: [id number]  Arrival date & time 12/19/11  2012   First MD Initiated Contact with Patient 12/19/11 2200      Chief Complaint  Patient presents with  . Chest Pain    Patient is a 26 y.o. female presenting with chest pain. The history is provided by the patient.  Chest Pain Episode onset: today. Chest pain occurs constantly. The chest pain is unchanged. Associated with: belching. The severity of the pain is moderate. The quality of the pain is described as burning. The pain does not radiate. Primary symptoms include shortness of breath and dizziness.  Associated symptoms include numbness.   pt reports chest burning, nausea, frequent belching and numbness.  She reports when her belching worsens she has numbness to her extremities.    Past Medical History  Diagnosis Date  . Anxiety   . Asthma   . GERD (gastroesophageal reflux disease)   . Stomach ulcer     Past Surgical History  Procedure Date  . No past surgeries     History reviewed. No pertinent family history.  History  Substance Use Topics  . Smoking status: Current Everyday Smoker  . Smokeless tobacco: Not on file  . Alcohol Use: No    OB History    Grav Para Term Preterm Abortions TAB SAB Ect Mult Living                  Review of Systems  Respiratory: Positive for shortness of breath.   Cardiovascular: Positive for chest pain.  Neurological: Positive for dizziness and numbness.  All other systems reviewed and are negative.    Allergies  Review of patient's allergies indicates no known allergies.  Home Medications   Current Outpatient Rx  Name Route Sig Dispense Refill  . CALCIUM CARBONATE ANTACID 750 MG PO CHEW Oral Chew 1 tablet by mouth 3 (three) times daily.    Marland Kitchen LORAZEPAM 1 MG PO TABS Oral Take 1 tablet (1 mg total) by mouth every 8 (eight) hours. As needed for anxiety 15 tablet 0  . ALIGN PO Oral Take 1 capsule by mouth every 12 (twelve) hours.    Marland Kitchen RIFAXIMIN 550 MG PO TABS  Oral Take 550 mg by mouth every 12 (twelve) hours.     Marland Kitchen SIMETHICONE 80 MG PO CHEW Oral Chew 80 mg by mouth every 6 (six) hours as needed. For stomach upset      BP 143/76  Pulse 159  Temp(Src) 98.1 F (36.7 C) (Oral)  Resp 20  SpO2 99%  LMP 11/11/2011  Physical Exam CONSTITUTIONAL: Well developed/well nourished, anxious HEAD AND FACE: Normocephalic/atraumatic EYES: EOMI/PERRL ENMT: Mucous membranes moist NECK: supple no meningeal signs SPINE:entire spine nontender CV: S1/S2 noted, no murmurs/rubs/gallops noted LUNGS: Lungs are clear to auscultation bilaterally, no apparent distress ABDOMEN: soft, nontender, no rebound or guarding GU:no cva tenderness NEURO: Pt is awake/alert, moves all extremitiesx4 No focal weakness noted in her extremities EXTREMITIES: pulses normal, full ROM SKIN: warm, color normal PSYCH: anxious  ED Course  Procedures   Labs Reviewed  CBC - Abnormal; Notable for the following:    MCV 75.4 (*)    All other components within normal limits  BASIC METABOLIC PANEL - Abnormal; Notable for the following:    Potassium 3.3 (*)    All other components within normal limits   Dg Chest 2 View  12/19/2011  *RADIOLOGY REPORT*  Clinical Data: Chest soreness.  History of gastroesophageal reflux disease.  CHEST -  2 VIEW  Comparison: Chest x-ray 10/24/2011.  Findings: Lung volumes are normal.  No consolidative airspace disease.  No pleural effusions.  No pneumothorax.  No pulmonary nodule or mass noted.  Pulmonary vasculature and the cardiomediastinal silhouette are within normal limits.  IMPRESSION: 1. No radiographic evidence of acute cardiopulmonary disease.  Original Report Authenticated By: Florencia Reasons, M.D.   11:54 PM BP 107/78  Pulse 77  Temp(Src) 98.1 F (36.7 C) (Oral)  Resp 18  SpO2 98%  LMP 11/11/2011 Pt appears improved She is less anxious She reports long h/o "GERD" which causes pain into chest which causes her anxiety.  She also has  frequent belching No syncope Her exam is otherwise unremarkable She reports she has had numbness previously on left side, and no focal weakness noted on my exam   MDM  Nursing notes reviewed and considered in documentation All labs/vitals reviewed and considered xrays reviewed and considered        Date: 12/19/2011  Rate: 73  Rhythm: normal sinus rhythm  QRS Axis: normal  Intervals: normal  ST/T Wave abnormalities: nonspecific ST changes  Conduction Disutrbances:none  Narrative Interpretation:   Old EKG Reviewed: unchanged    Joya Gaskins, MD 12/19/11 2356

## 2011-12-24 ENCOUNTER — Emergency Department (HOSPITAL_COMMUNITY)
Admission: EM | Admit: 2011-12-24 | Discharge: 2011-12-24 | Disposition: A | Discharge status: Home / Self Care | Payer: Self-pay | Attending: Emergency Medicine | Admitting: Emergency Medicine

## 2011-12-24 ENCOUNTER — Encounter (HOSPITAL_COMMUNITY): Payer: Self-pay | Admitting: Emergency Medicine

## 2011-12-24 DIAGNOSIS — R142 Eructation: Secondary | ICD-10-CM | POA: Insufficient documentation

## 2011-12-24 DIAGNOSIS — R143 Flatulence: Secondary | ICD-10-CM | POA: Insufficient documentation

## 2011-12-24 DIAGNOSIS — J45909 Unspecified asthma, uncomplicated: Secondary | ICD-10-CM | POA: Insufficient documentation

## 2011-12-24 DIAGNOSIS — R141 Gas pain: Secondary | ICD-10-CM | POA: Insufficient documentation

## 2011-12-24 DIAGNOSIS — R066 Hiccough: Secondary | ICD-10-CM | POA: Insufficient documentation

## 2011-12-24 DIAGNOSIS — R071 Chest pain on breathing: Secondary | ICD-10-CM | POA: Insufficient documentation

## 2011-12-24 MED ORDER — PANTOPRAZOLE SODIUM 20 MG PO TBEC: 20.0000 mg | DELAYED_RELEASE_TABLET | Freq: Every day | ORAL | Status: DC

## 2011-12-24 MED ORDER — PANTOPRAZOLE SODIUM 40 MG PO TBEC
40.0000 mg | DELAYED_RELEASE_TABLET | Freq: Once | ORAL | Status: AC
  Administered 2011-12-24: 40 mg via ORAL
  Filled 2011-12-24: qty 1

## 2011-12-24 MED ORDER — FAMOTIDINE 20 MG PO TABS: 20.0000 mg | ORAL_TABLET | Freq: Two times a day (BID) | ORAL | Status: DC

## 2011-12-24 MED ORDER — LORAZEPAM 1 MG PO TABS: 1.0000 mg | ORAL_TABLET | Freq: Three times a day (TID) | ORAL | Status: AC | PRN

## 2011-12-24 MED ORDER — LORAZEPAM 1 MG PO TABS
1.0000 mg | ORAL_TABLET | Freq: Once | ORAL | Status: AC
  Administered 2011-12-24: 1 mg via ORAL
  Filled 2011-12-24: qty 1

## 2011-12-24 MED ORDER — FAMOTIDINE 20 MG PO TABS
20.0000 mg | ORAL_TABLET | Freq: Once | ORAL | Status: AC
  Administered 2011-12-24: 20 mg via ORAL
  Filled 2011-12-24: qty 1

## 2011-12-24 NOTE — ED Provider Notes (Signed)
9:02 PM  Date: 12/24/2011  Rate:64  Rhythm: normal sinus rhythm  QRS Axis: normal  Intervals: normal  ST/T Wave abnormalities: nonspecific T wave changes  Conduction Disutrbances:none  Narrative Interpretation: Abnormal EKG  Old EKG Reviewed: unchanged    Carleene Cooper III, MD 12/24/11 2102

## 2011-12-24 NOTE — ED Notes (Signed)
Pt states understanding of discharge instructions 

## 2011-12-24 NOTE — ED Notes (Addendum)
C/o hiccups, belching, trouble breathing, and anxiety x 2 hours.  Pt took Gas-X without relief.  Reports history of same. States chest is sore from hiccups and belching.

## 2011-12-24 NOTE — ED Notes (Signed)
EKG done again per Dr. Anitra Lauth request. Printed and given to him

## 2011-12-24 NOTE — ED Provider Notes (Signed)
History     CSN: [id number]  Arrival date & time 12/24/11  1820   First MD Initiated Contact with Patient 12/24/11 1905      Chief Complaint  Patient presents with  . Hiccups    (Consider location/radiation/quality/duration/timing/severity/associated sxs/prior treatment) HPI  26 year old female with history of asthma, history of stomach ulcer, and history of anxiety presents complaining of hiccups. Patient states for the past 4 hours she has had continuous hiccups unrelieved with gas X. Patient reports she has to was diagnosed with GERD for the past 2 months she has had intermittent bouts of both belching and hiccups. Symptoms associated with epigastric burning sensation. She relates it to her bad eating habit.  She denies any alcoholic use. She has been seen in ER for same before.  It was thought that this is noncardiac related.    Past Medical History  Diagnosis Date  . Anxiety   . Asthma   . GERD (gastroesophageal reflux disease)   . Stomach ulcer     Past Surgical History  Procedure Date  . No past surgeries     No family history on file.  History  Substance Use Topics  . Smoking status: Current Everyday Smoker  . Smokeless tobacco: Not on file  . Alcohol Use: No    OB History    Grav Para Term Preterm Abortions TAB SAB Ect Mult Living                  Review of Systems  All other systems reviewed and are negative.    Allergies  Review of patient's allergies indicates no known allergies.  Home Medications   Current Outpatient Rx  Name Route Sig Dispense Refill  . SIMETHICONE 80 MG PO CHEW Oral Chew 80 mg by mouth every 6 (six) hours as needed. For stomach upset      BP 135/94  Pulse 79  Temp(Src) 98.1 F (36.7 C) (Oral)  Resp 18  SpO2 100%  LMP 12/17/2011  Physical Exam  Nursing note and vitals reviewed. Constitutional: She is oriented to person, place, and time. She appears well-developed and well-nourished. No distress.  HENT:  Head:  Normocephalic and atraumatic.  Mouth/Throat: Oropharynx is clear and moist. No oropharyngeal exudate.       Continuous eructation on exam, along with singultus intermittently.  Eyes: Conjunctivae are normal.  Neck: Normal range of motion. Neck supple. No JVD present.  Cardiovascular: Normal rate and regular rhythm.  Exam reveals no gallop and no friction rub.   No murmur heard. Pulmonary/Chest: Effort normal and breath sounds normal. No stridor. No respiratory distress. She has no wheezes. She exhibits no tenderness.       Diffuse chest wall tenderness with no point tenderness, no crepitus, no emphysema  Abdominal: Soft. There is tenderness in the epigastric area.  Musculoskeletal: Normal range of motion. She exhibits no edema.  Lymphadenopathy:    She has no cervical adenopathy.  Neurological: She is alert and oriented to person, place, and time.  Skin: Skin is warm. No rash noted.    ED Course  Procedures (including critical care time)  Labs Reviewed - No data to display No results found.   No diagnosis found.   Date: 12/24/2011  Rate: 64  Rhythm: normal sinus rhythm  QRS Axis: normal  Intervals: normal  ST/T Wave abnormalities: nonspecific T wave changes  Conduction Disutrbances:none  Narrative Interpretation:   Old EKG Reviewed: unchanged    1. Eructation 2. Singultus 3. Chest wall  pain  MDM  Recurrent eructation and singultus for the past few months.  Pt is scheduled to be evaluated by Dr. Loreta Ave from GI tomorrow.    Ativan given for sxs, which has helped her singultus, however she continues to have eructation.  ECG unchanged.  VSS, afebrile. Protonix and pepcid given.  Pt has chest wall pain, secondary to having singultus for the past few hours.     8:55 PM Pt is symptom free at this time. She feels really happy. She is scheduled to follow up tomorrow. She will be discharged with the appropriate medications.     Fayrene Helper, PA-C 12/24/11 2057

## 2011-12-24 NOTE — Discharge Instructions (Signed)
Hiccups Hiccups are caused by a sudden contraction of the muscles between the ribs and the muscle under your lungs (diaphragm). When you hiccup, the top of your windpipe (glottis) closes immediately after your diaphragm contracts. This makes the typical 'hic' sound. A hiccup is a reflex that you cannot stop. Unlike other reflexes such as coughing and sneezing, hiccups do not seem to have any useful purpose. There are 3 types of hiccups:   Benign bouts: last less than 48 hours.   Persistent: last more than 48 hours but less than 1 month.   Intractable: last more than 1 month.  CAUSES  Most people have bouts of hiccups from time to time. They start for no apparent reason, last a short while, and then stop. Sometimes they are due to:  A temporary swollen stomach caused by overeating or eating too fast, eating spicy foods, drinking fizzy drinks, or swallowing air.   A sudden change in temperature (very hot or cold foods or drinks, a cold shower).   Drinking alcohol or using tobacco.  There are no particular tests used to diagnose hiccups. Hiccups are usually considered harmless and do not point to a serious medical condition. However, there can be underlying medical problems that may cause hiccups, such as pneumonia, diabetes, metabolic problems, tumors, abdominal infections or injuries, and neurologic problems.You must follow up with your caregiver if your symptoms persist or become a frequent problem. TREATMENT   Most cases need no treatment. A bout of benign hiccups usually does not last long.   Medicine is sometimes needed to stop persistent hiccups. Medicine may be given intravenously (IV) or by mouth.   Hypnosis or acupuncture may be suggested.   Surgery to affect the nerve that supplies the diaphragm may be tried in severe cases.   Treatment of an underlying cause is needed in some cases.  HOME CARE INSTRUCTIONS  Popular remedies that may stop a bout of hiccups include:  Gargling  ice water.   Swallowing granulated sugar.   Biting on a lemon.   Holding your breath, breathing fast, or breathing into a paper bag.   Bearing down.   Gasping after a sudden fright.   Pulling your tongue gently.   Distraction.  SEEK MEDICAL CARE IF:   Hiccups last for more than 48 hours.   You are given medicine but your hiccups do not get better.   New symptoms show up.   You cannot sleep or eat due to the hiccups.   You have unexpected weight loss.   You have trouble breathing or swallowing.   You develop severe pain in your abdomen or other areas.   You develop numbness, tingling, or weakness.  Document Released: 09/11/2001 Document Revised: 06/22/2011 Document Reviewed: 08/24/2010 [facility] Patient Information 2012 [place], [place].  Bloating Bloating is the feeling of fullness in your belly. You may feel as though your pants are too tight. Often the cause of bloating is overeating, retaining fluids, or having gas in your bowel. It is also caused by swallowing air and eating foods that cause gas. Irritable bowel syndrome is one of the most common causes of bloating. Constipation is also a common cause. Sometimes more serious problems can cause bloating. SYMPTOMS  Usually there is a feeling of fullness, as though your abdomen is bulged out. There may be mild discomfort.  DIAGNOSIS  Usually no particular testing is necessary for most bloating. If the condition persists and seems to become worse, your caregiver may do additional testing.  TREATMENT   There is no direct treatment for bloating.   Do not put gas into the bowel. Avoid chewing gum and sucking on candy. These tend to make you swallow air. Swallowing air can also be a nervous habit. Try to avoid this.   Avoiding high residue diets will help. Eat foods with soluble fibers (examples include root vegetables, apples, or barley) and substitute dairy products with soy and rice products. This helps irritable bowel  syndrome.   If constipation is the cause, then a high residue diet with more fiber will help.   Avoid carbonated beverages.   Over-the-counter preparations are available that help reduce gas. Your pharmacist can help you with this.  SEEK MEDICAL CARE IF:   Bloating continues and seems to be getting worse.   You notice a weight gain.   You have a weight loss but the bloating is getting worse.   You have changes in your bowel habits or develop nausea or vomiting.  SEEK IMMEDIATE MEDICAL CARE IF:   You develop shortness of breath or swelling in your legs.   You have an increase in abdominal pain or develop chest pain.  Document Released: 05/03/2006 Document Revised: 06/22/2011 Document Reviewed: 06/21/2007 [facility] Patient Information 2012 [place], [place].

## 2011-12-26 NOTE — ED Provider Notes (Signed)
Medical screening examination/treatment/procedure(s) were performed by non-physician practitioner and as supervising physician I was immediately available for consultation/collaboration.   Carleene Cooper III, MD 12/26/11 [phone]

## 2012-01-04 ENCOUNTER — Inpatient Hospital Stay (HOSPITAL_COMMUNITY)
Admission: AD | Admit: 2012-01-04 | Discharge: 2012-01-04 | Disposition: A | Discharge status: Home / Self Care | Payer: Self-pay | Source: Ambulatory Visit | Attending: Obstetrics & Gynecology | Admitting: Obstetrics & Gynecology

## 2012-01-04 ENCOUNTER — Encounter (HOSPITAL_COMMUNITY): Payer: Self-pay | Admitting: *Deleted

## 2012-01-04 DIAGNOSIS — K219 Gastro-esophageal reflux disease without esophagitis: Secondary | ICD-10-CM | POA: Insufficient documentation

## 2012-01-04 DIAGNOSIS — F419 Anxiety disorder, unspecified: Secondary | ICD-10-CM

## 2012-01-04 DIAGNOSIS — F411 Generalized anxiety disorder: Secondary | ICD-10-CM | POA: Insufficient documentation

## 2012-01-04 MED ORDER — LORAZEPAM 1 MG PO TABS
1.0000 mg | ORAL_TABLET | Freq: Once | ORAL | Status: AC
  Administered 2012-01-04: 1 mg via ORAL
  Filled 2012-01-04: qty 1

## 2012-01-04 NOTE — MAU Provider Note (Signed)
Deborah [name] y.o.[id number]  Chief Complaint  Patient presents with  . Anxiety  . Gastrophageal Reflux        SUBJECTIVE  HPI: Unable to get her Ativan Rx filled until tomorrow AM so presents here requesting Ativan to control her intractable burping and hiccups.   Past Medical History  Diagnosis Date  . Anxiety   . Asthma   . GERD (gastroesophageal reflux disease)   . Stomach ulcer    Past Surgical History  Procedure Date  . No past surgeries   . Leg surgery    History   Social History  . Marital Status: Single    Spouse Name: N/A    Number of Children: N/A  . Years of Education: N/A   Occupational History  . Not on file.   Social History Main Topics  . Smoking status: Current Everyday Smoker  . Smokeless tobacco: Not on file  . Alcohol Use: No     OCCAS  . Drug Use: No  . Sexually Active: No   Other Topics Concern  . Not on file   Social History Narrative  . No narrative on file   No current facility-administered medications on file prior to encounter.   Current Outpatient Prescriptions on File Prior to Encounter  Medication Sig Dispense Refill  . pantoprazole (PROTONIX) 20 MG tablet Take 1 tablet (20 mg total) by mouth daily.  20 tablet  0  . simethicone (MYLICON) 80 MG chewable tablet Chew 80 mg by mouth every 6 (six) hours as needed. For stomach upset/gas      . LORazepam (ATIVAN) 1 MG tablet Take 1 tablet (1 mg total) by mouth 3 (three) times daily as needed for anxiety.  10 tablet  0   No Known Allergies  ROS: Pertinent items in HPI  OBJECTIVE Blood pressure 98/58, pulse 109, temperature 99 F (37.2 C), temperature source Oral, resp. rate 20, height 5\' 4"  (1.626 m), weight 53.524 kg (118 lb), last menstrual period 12/30/2011, SpO2 98.00%.  GENERAL: Well-developed, well-nourished female in distress with frequent burping and hiccupping. Marland Kitchen  HEENT: Normocephalic, good dentition HEART: normal rate RESP: normal effort ABDOMEN: Soft,  nontender EXTREMITIES: Nontender, no edema NEURO: Alert and oriented     MAU course: Ativan 1 mg po given      ASSESSMENT   PLAN Care assumed by [facility], NP at 2015     [facility] 01/04/2012 8:10 PM   Deborah [name] is a 26 y.o. female who presents to MAU for medication refill. States she has GERD and takes Ativan. She ran out today and thought she would be ok until tomorrow, however, she began having burping and GERD symptoms. Once they start she becomes anxious. She denies any other problems. The history was provided by the patient.   ROS:  As states in HPI  Exam: Patient is alert and oriented and in no acute distress. She has already received Ativan 1 mg and her symptoms have almost resolved. Her heart rate is normal now. Regular rhythm. Her lungs are clear.   Assessment: GERD with anxiety resulting   Plan:  Will d/c home to follow up with Dr. Loreta Ave (GI) tomorrow   Return here as needed for GYN problems.

## 2012-01-04 NOTE — MAU Note (Signed)
SAYS SHE NEEDS ATIVAN

## 2012-01-04 NOTE — MAU Note (Signed)
PT  GOES TO DR Loreta Ave- FOR  GERD AND STOMACH ULCER-  X3 MTHS.   PT USUALLY TAKES MEDS TO CONTROL BURPING/ HICCUPS .- BUT SHE RAN OUT AND RX WENT TO [facility]- THEY SAID  WOULD NOT BE  READY  AT 8 AM..  NOT NAUSEA.  ATE AT 6PM-  DR MANN TOLD PT COME TO ER.

## 2012-01-04 NOTE — MAU Note (Signed)
Recently dx with GERD.  Makes her belch, chest is getting tight/sore.  Makes her anxious.can't sleep, can't relax.  Ran out of meds, had called dr, is to be able to get rx tomorrow

## 2012-04-18 ENCOUNTER — Emergency Department (HOSPITAL_COMMUNITY)
Admission: EM | Admit: 2012-04-18 | Discharge: 2012-04-18 | Disposition: A | Discharge status: Home / Self Care | Payer: Self-pay | Attending: Emergency Medicine | Admitting: Emergency Medicine

## 2012-04-18 ENCOUNTER — Encounter (HOSPITAL_COMMUNITY): Payer: Self-pay | Admitting: Emergency Medicine

## 2012-04-18 DIAGNOSIS — F17200 Nicotine dependence, unspecified, uncomplicated: Secondary | ICD-10-CM | POA: Insufficient documentation

## 2012-04-18 DIAGNOSIS — K219 Gastro-esophageal reflux disease without esophagitis: Secondary | ICD-10-CM | POA: Insufficient documentation

## 2012-04-18 MED ORDER — ACETAMINOPHEN 325 MG PO TABS
650.0000 mg | ORAL_TABLET | Freq: Once | ORAL | Status: AC
  Administered 2012-04-18: 650 mg via ORAL
  Filled 2012-04-18: qty 2

## 2012-04-18 MED ORDER — GI COCKTAIL ~~LOC~~
30.0000 mL | Freq: Once | ORAL | Status: AC
  Administered 2012-04-18: 30 mL via ORAL
  Filled 2012-04-18: qty 30

## 2012-04-18 MED ORDER — AMOXICILL-CLARITHRO-LANSOPRAZ PO MISC: Freq: Two times a day (BID) | ORAL | Status: AC

## 2012-04-18 NOTE — ED Provider Notes (Signed)
History     CSN: [id number]  Arrival date & time 04/18/12  0206   First MD Initiated Contact with Patient 04/18/12 0224      Chief Complaint  Patient presents with  . Chest Pain    (Consider location/radiation/quality/duration/timing/severity/associated sxs/prior treatment) HPI Comments: Hx of GERD - dx in March of this year.  She has then developed a stomach ulcer in the summer.  Has frequent belching.  Has chest wall soreness and then SOB when she gets anxious and then she can't sleep.  This happens to her intermittently - once a month or so.  Has been using Simethicone tablets.  Does not use any medicines for her stomach acid.  She does keep a strict diet which helps.  Today she hurts in the upper chest and upper back on the L side - is a soreness.  Has a SOB with this.  It is constant, nothing makes worse, better with belching. She feels uncomfortable from the SOB and feels "panicky".  Is now using the vapor cigarettes and not using traditional cigarretes anymore.    Patient is a 26 y.o. female presenting with chest pain. The history is provided by the patient.  Chest Pain Primary symptoms include shortness of breath and cough. Pertinent negatives for primary symptoms include no fever, no nausea and no vomiting.  Pertinent negatives for associated symptoms include no numbness.     Past Medical History  Diagnosis Date  . Anxiety   . Asthma   . GERD (gastroesophageal reflux disease)   . Stomach ulcer     Past Surgical History  Procedure Date  . No past surgeries   . Leg surgery     History reviewed. No pertinent family history.  History  Substance Use Topics  . Smoking status: Current Every Day Smoker  . Smokeless tobacco: Not on file  . Alcohol Use: No     OCCAS    OB History    Grav Para Term Preterm Abortions TAB SAB Ect Mult Living   0               Review of Systems  Constitutional: Negative for fever and chills.  HENT: Negative for sore throat.   Eyes:  Negative for visual disturbance.  Respiratory: Positive for cough and shortness of breath.   Cardiovascular: Positive for chest pain.  Gastrointestinal: Negative for nausea and vomiting.  Genitourinary: Negative for dysuria.  Musculoskeletal: Negative for back pain.  Skin: Negative for rash.  Neurological: Negative for numbness and headaches.    Allergies  Review of patient's allergies indicates no known allergies.  Home Medications   Current Outpatient Rx  Name Route Sig Dispense Refill  . AMOXICILL-CLARITHRO-LANSOPRAZ PO MISC Oral Take by mouth 2 (two) times daily. Follow package directions. 1 kit 0    BP 112/74  Pulse 93  Temp 98.7 F (37.1 C) (Oral)  Resp 18  SpO2 100%  Physical Exam  Nursing note and vitals reviewed. Constitutional: She appears well-developed and well-nourished. No distress.  HENT:  Head: Normocephalic and atraumatic.  Mouth/Throat: Oropharynx is clear and moist. No oropharyngeal exudate.  Eyes: Conjunctivae normal and EOM are normal. Pupils are equal, round, and reactive to light. Right eye exhibits no discharge. Left eye exhibits no discharge. No scleral icterus.  Neck: Normal range of motion. Neck supple. No JVD present. No thyromegaly present.  Cardiovascular: Normal rate, regular rhythm, normal heart sounds and intact distal pulses.  Exam reveals no gallop and no friction rub.  No murmur heard.      Normal heart sounds, no murmurs, rubs or gallops, strong pulses at the radial arteries.  Pulmonary/Chest: Effort normal and breath sounds normal. No respiratory distress. She has no wheezes. She has no rales.       Speaks in full sentences, no abnormal lung sounds  Abdominal: Soft. Bowel sounds are normal. She exhibits no distension and no mass. There is no tenderness.       No ttp in the abdomen, no masses, no guarding, frequent belching.  Musculoskeletal: Normal range of motion. She exhibits no edema and no tenderness.  Lymphadenopathy:    She has  no cervical adenopathy.  Neurological: She is alert. Coordination normal.  Skin: Skin is warm and dry. No rash noted. No erythema.  Psychiatric: She has a normal mood and affect. Her behavior is normal.    ED Course  Procedures (including critical care time)  Labs Reviewed - No data to display No results found.   1. GERD (gastroesophageal reflux disease)       MDM  Well appaering, has GI illness.  Will need GI f/u, GI cocktail.  Pepcid, PrevPac.  She states that she does take Pepcid intermittently.  Patient stable for discharge, referred to GI as outpatient  ED ECG REPORT  I personally interpreted this EKG   Date: 04/18/2012   Rate: 93  Rhythm: normal sinus rhythm  QRS Axis: normal  Intervals: normal  ST/T Wave abnormalities: normal  Conduction Disutrbances:none  Narrative Interpretation:   Old EKG Reviewed: Compared with 12/24/2011, no significant change         Vida Roller, MD 04/18/12 [phone]

## 2012-04-18 NOTE — ED Notes (Signed)
Pt reports having gas build up in chest, and causing her to have chest pressure; pt belching at triage

## 2012-06-03 ENCOUNTER — Encounter (HOSPITAL_COMMUNITY): Payer: Self-pay | Admitting: *Deleted

## 2012-06-03 ENCOUNTER — Emergency Department (HOSPITAL_COMMUNITY)
Admission: EM | Admit: 2012-06-03 | Discharge: 2012-06-03 | Disposition: A | Discharge status: Home / Self Care | Payer: Self-pay | Attending: Emergency Medicine | Admitting: Emergency Medicine

## 2012-06-03 DIAGNOSIS — J45909 Unspecified asthma, uncomplicated: Secondary | ICD-10-CM | POA: Insufficient documentation

## 2012-06-03 DIAGNOSIS — K219 Gastro-esophageal reflux disease without esophagitis: Secondary | ICD-10-CM | POA: Insufficient documentation

## 2012-06-03 DIAGNOSIS — F17200 Nicotine dependence, unspecified, uncomplicated: Secondary | ICD-10-CM | POA: Insufficient documentation

## 2012-06-03 DIAGNOSIS — F411 Generalized anxiety disorder: Secondary | ICD-10-CM | POA: Insufficient documentation

## 2012-06-03 DIAGNOSIS — Z792 Long term (current) use of antibiotics: Secondary | ICD-10-CM | POA: Insufficient documentation

## 2012-06-03 DIAGNOSIS — Z79899 Other long term (current) drug therapy: Secondary | ICD-10-CM | POA: Insufficient documentation

## 2012-06-03 DIAGNOSIS — K259 Gastric ulcer, unspecified as acute or chronic, without hemorrhage or perforation: Secondary | ICD-10-CM | POA: Insufficient documentation

## 2012-06-03 HISTORY — DX: Hiccough: R06.6

## 2012-06-03 MED ORDER — PANTOPRAZOLE SODIUM 20 MG PO TBEC: 20.0000 mg | DELAYED_RELEASE_TABLET | Freq: Every day | ORAL | Status: AC

## 2012-06-03 MED ORDER — FAMOTIDINE 20 MG PO TABS
20.0000 mg | ORAL_TABLET | Freq: Once | ORAL | Status: AC
  Administered 2012-06-03: 20 mg via ORAL
  Filled 2012-06-03: qty 1

## 2012-06-03 MED ORDER — SUCRALFATE 1 G PO TABS: 1.0000 g | ORAL_TABLET | Freq: Four times a day (QID) | ORAL | Status: AC

## 2012-06-03 MED ORDER — FAMOTIDINE 20 MG PO TABS: 20.0000 mg | ORAL_TABLET | Freq: Two times a day (BID) | ORAL | Status: AC

## 2012-06-03 NOTE — ED Notes (Signed)
MD at bedside. 

## 2012-06-03 NOTE — ED Notes (Addendum)
Here by POV, here with other, h/o intractable hiccups, GERD and anxiety, has been seen here for the same in the past. C/o continuous "belching"/hiccups. States, "it is triggering anxiety". Alert, NAD,calm,interactive, resps e/u, ambulatory with steady gait, speech clear. Straight back to room for triage. States, "it is making my chest sore".

## 2012-06-03 NOTE — ED Provider Notes (Signed)
History     CSN: [id number]  Arrival date & time 06/03/12  0446   First MD Initiated Contact with Patient 06/03/12 0457      Chief Complaint  Patient presents with  . Hiccups    (Consider location/radiation/quality/duration/timing/severity/associated sxs/prior treatment) HPI Comments: Pt presents with c/o burping and hicupping.  She states that it is constant over the last 4 hours, nothing makes better or worse though she had had significant improvement over the last several months when she uses the antacids but at this time she takes only GAS X.  Deneis CP, cough, fever, vomiting, back pain, swelling or other c/o.  She has been seen multiple times in the past for similar complaint.  The history is provided by the patient and medical records.    Past Medical History  Diagnosis Date  . Anxiety   . Asthma   . GERD (gastroesophageal reflux disease)   . Stomach ulcer   . Intractable hiccups     Past Surgical History  Procedure Date  . No past surgeries   . Leg surgery     No family history on file.  History  Substance Use Topics  . Smoking status: Current Every Day Smoker  . Smokeless tobacco: Not on file  . Alcohol Use: No     Comment: OCCAS    OB History    Grav Para Term Preterm Abortions TAB SAB Ect Mult Living   0               Review of Systems  All other systems reviewed and are negative.    Allergies  Review of patient's allergies indicates no known allergies.  Home Medications   Current Outpatient Rx  Name  Route  Sig  Dispense  Refill  . AMOXICILL-CLARITHRO-LANSOPRAZ PO MISC   Oral   Take by mouth 2 (two) times daily. Follow package directions.   1 kit   0   . FAMOTIDINE 20 MG PO TABS   Oral   Take 1 tablet (20 mg total) by mouth 2 (two) times daily.   30 tablet   0   . PANTOPRAZOLE SODIUM 20 MG PO TBEC   Oral   Take 1 tablet (20 mg total) by mouth daily.   30 tablet   0   . SUCRALFATE 1 G PO TABS   Oral   Take 1 tablet (1 g  total) by mouth 4 (four) times daily.   30 tablet   0     BP 112/74  Pulse 85  Temp 98.2 F (36.8 C) (Oral)  Resp 18  SpO2 99%  LMP 05/31/2012  Physical Exam  Nursing note and vitals reviewed. Constitutional: She appears well-developed and well-nourished. No distress.  HENT:  Head: Normocephalic and atraumatic.  Mouth/Throat: Oropharynx is clear and moist. No oropharyngeal exudate.  Eyes: Conjunctivae normal are normal. Right eye exhibits no discharge. Left eye exhibits no discharge. No scleral icterus.  Cardiovascular: Normal rate, regular rhythm, normal heart sounds and intact distal pulses.  Exam reveals no gallop and no friction rub.   No murmur heard. Pulmonary/Chest: Effort normal and breath sounds normal. No respiratory distress. She has no wheezes. She has no rales.  Abdominal: Soft. Bowel sounds are normal. She exhibits no distension and no mass. There is no tenderness.  Psychiatric: She has a normal mood and affect. Her behavior is normal.    ED Course  Procedures (including critical care time)  Labs Reviewed - No data to display No  results found.   1. GERD (gastroesophageal reflux disease)       MDM  Pt appears stable - belching throughout the exam, VS normal, exam reassuring, stressed importance of ongoing antacid therapy.  Discharge Prescriptions include:  carafate pepcid protonix         Vida Roller, MD 06/03/12 [phone]

## 2012-06-03 NOTE — ED Notes (Signed)
C/o belching x 4 hours.  Took Gas-x and ibuprofen without relief.  Speaking in complete sentences.  C/o soreness in neck and chest.

## 2012-08-18 IMAGING — CT CT ABD-PELV W/ CM
2 of 3 series · 13 of 32 positions shown, 18 images · IV contrast (100ml omni 300)
Comparison: None.

CLINICAL DATA: Right lower quadrant abdominal pain.  History of
gastroesophageal reflux disease and stomach ulcers.

CT ABDOMEN AND PELVIS WITH CONTRAST
TECHNIQUE: Multidetector CT imaging of the abdomen and pelvis was
performed following the standard protocol during bolus
administration of intravenous contrast.
Contrast: 100mL OMNIPAQUE IOHEXOL 300 MG/ML  SOLN

[Series 2: routine abdomen · axial · 0.63mm/px · z∈[-342,-77]mm · 5 of 81 slices shown, 10 images]
[im 14/81  soft-tissue]
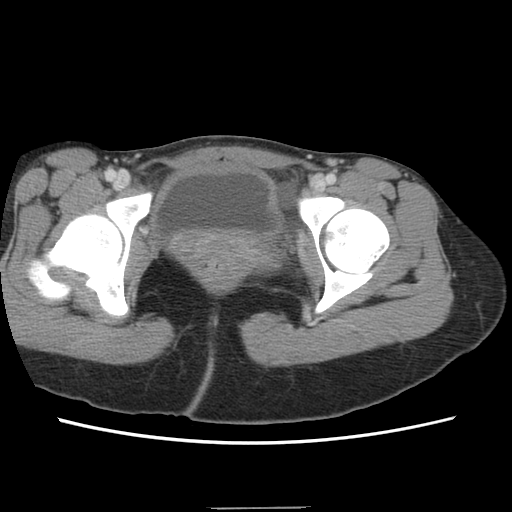
[im 14/81  bone]
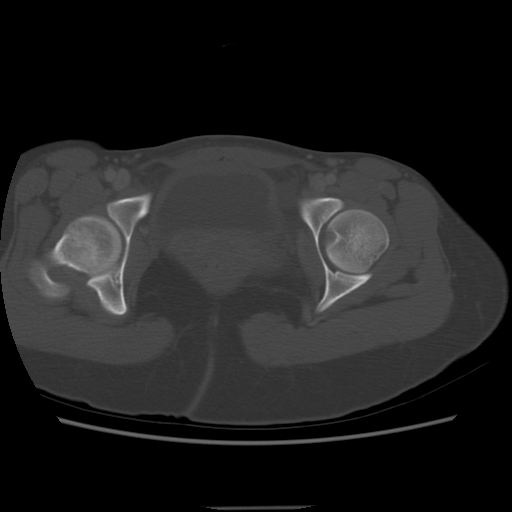
[im 27/81  soft-tissue]
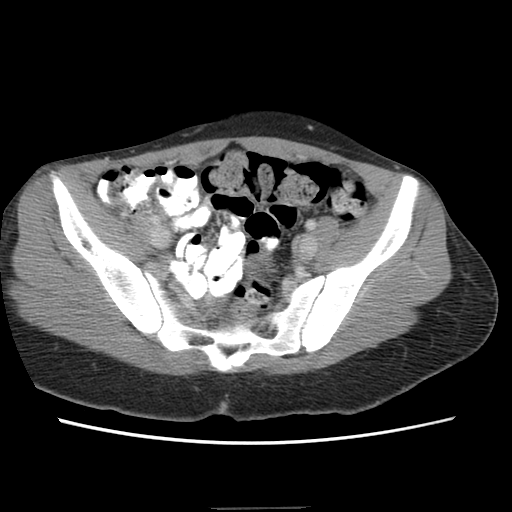
[im 27/81  lung]
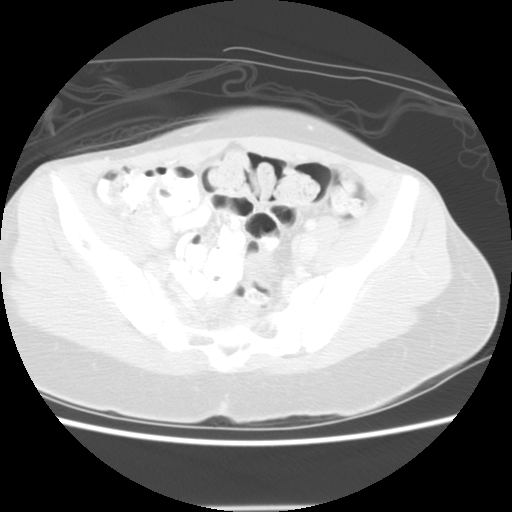
[im 41/81  soft-tissue]
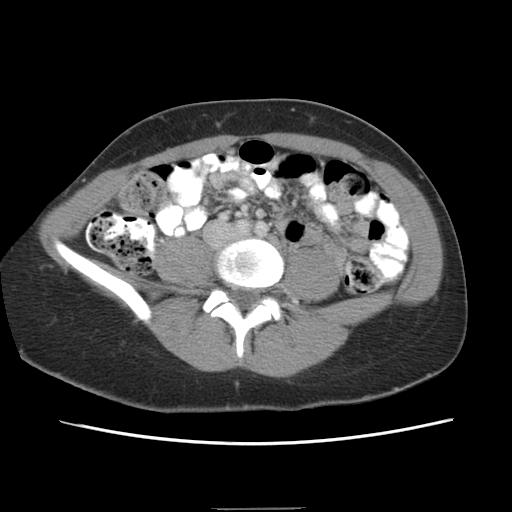
[im 41/81  lung]
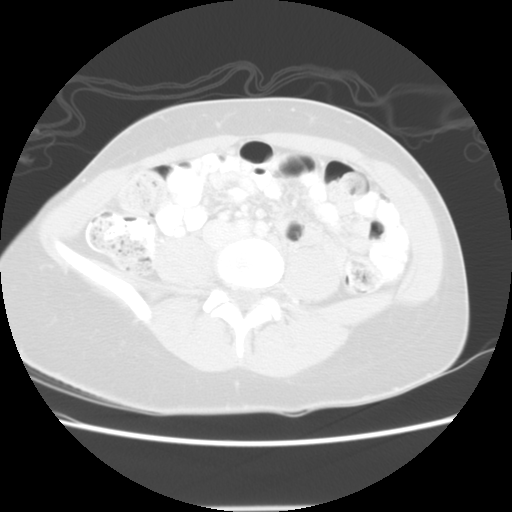
[im 54/81  soft-tissue]
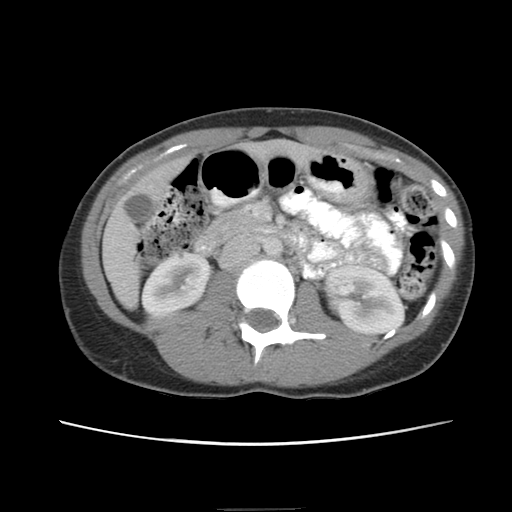
[im 54/81  lung]
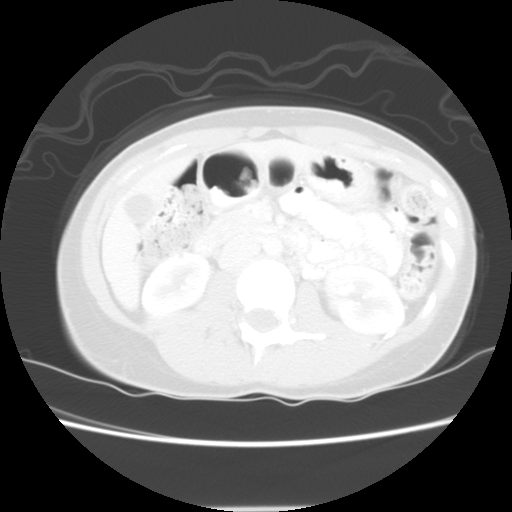
[im 67/81  soft-tissue]
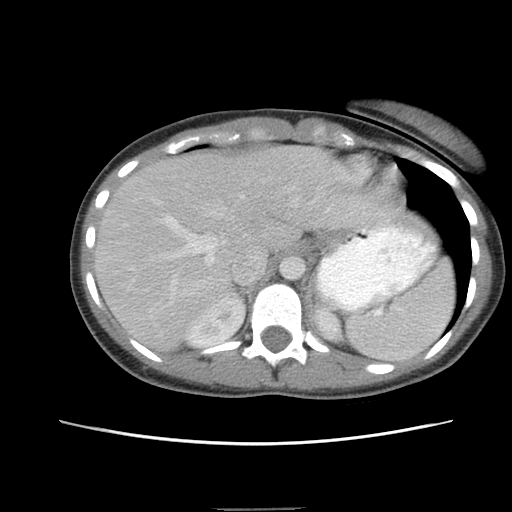
[im 67/81  lung]
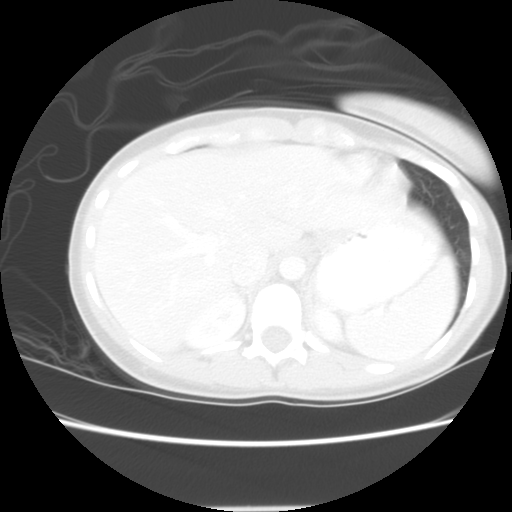

[Series 401: sag · sagittal · 0.81mm/px · 8 of 149 slices shown]
[im 12/149  soft-tissue]
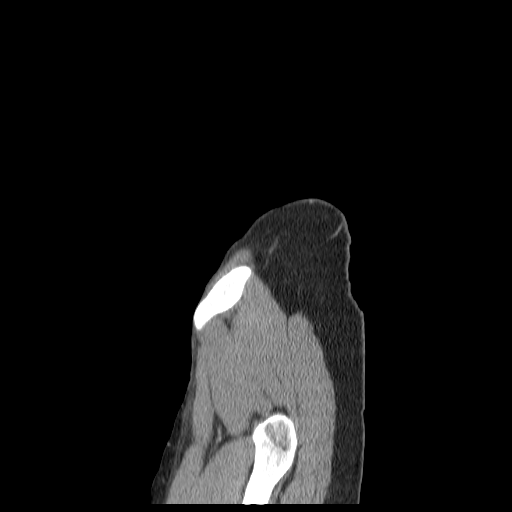
[im 35/149  soft-tissue]
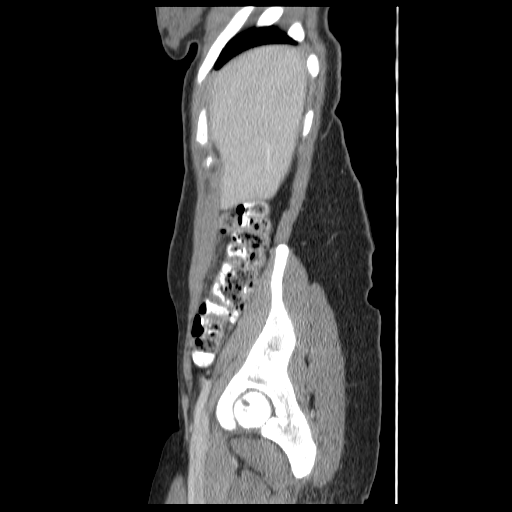
[im 46/149  soft-tissue]
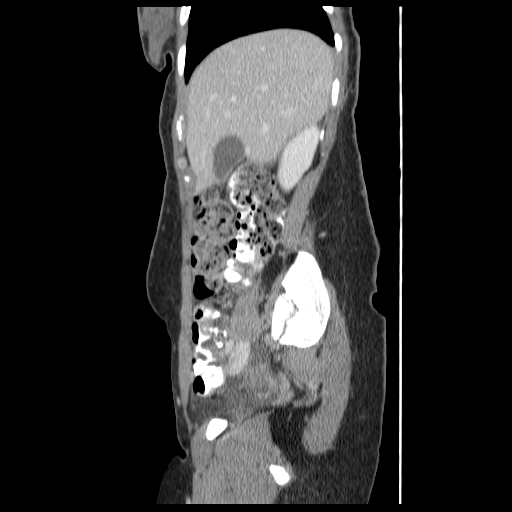
[im 69/149  soft-tissue]
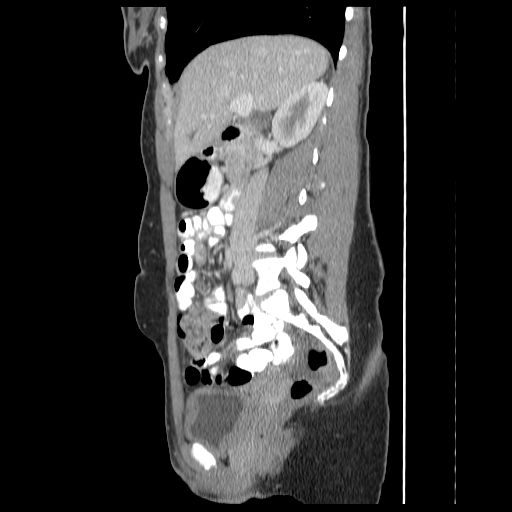
[im 80/149  soft-tissue]
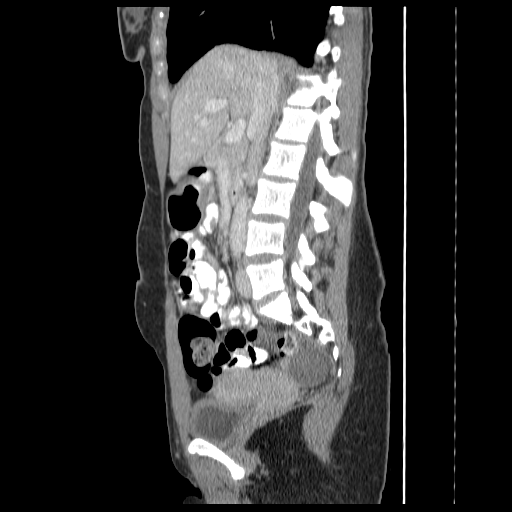
[im 103/149  soft-tissue]
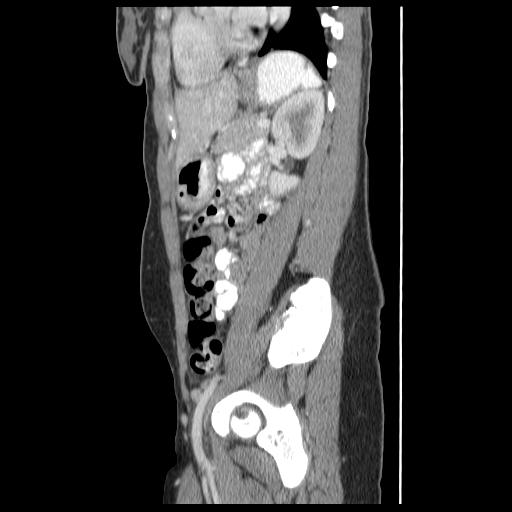
[im 114/149  soft-tissue]
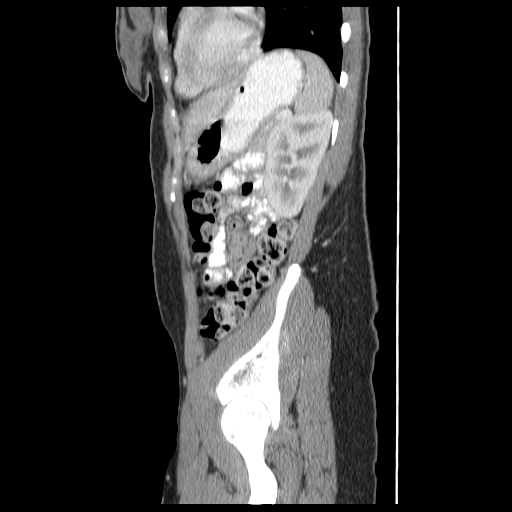
[im 137/149  soft-tissue]
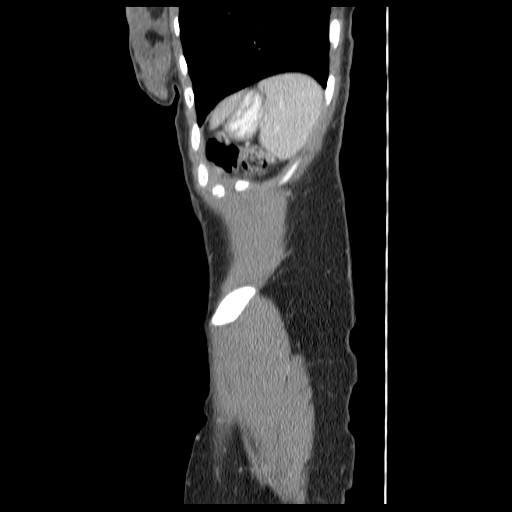

[13 of 32 positions shown; findings below may reference images not displayed]

FINDINGS: Slight fibrosis in the lung bases.  And

Are normal the liver, spleen, and gallbladder, and pancreas, and 88
adrenal glands, kidneys, abdominal aorta, kidneys and
retroperitoneal lymph nodes are unremarkable.  The gastric wall is
not thickened.  Small bowel are not dilated.  Stool filled colon
without distension.  No free air or free fluid in the abdomen.

Pelvis:  The uterus and adnexal structures are not enlarged.  No
focal fluid collection in the low pelvis may represent an ovarian
cyst or physiologic fluid.  The bladder wall is thickened which
might represent a bladder infection or hypertrophy.  No significant
lymphadenopathy in the pelvis.  The appendix is normal.  Normal
alignment of the lumbar vertebrae.
IMPRESSION: Thickening of the bladder wall suggesting bladder infection versus
hypertrophy.  Fluid collection in the posterior pelvis likely
representing a left ovarian cyst versus physiologic fluid.  The
appendix is normal.

## 2015-04-23 ENCOUNTER — Emergency Department (HOSPITAL_COMMUNITY)
Admission: EM | Admit: 2015-04-23 | Discharge: 2015-04-23 | Disposition: A | Discharge status: Home / Self Care | Payer: Self-pay | Attending: Emergency Medicine | Admitting: Emergency Medicine

## 2015-04-23 ENCOUNTER — Encounter (HOSPITAL_COMMUNITY): Payer: Self-pay

## 2015-04-23 DIAGNOSIS — Z720 Tobacco use: Secondary | ICD-10-CM | POA: Insufficient documentation

## 2015-04-23 DIAGNOSIS — J45909 Unspecified asthma, uncomplicated: Secondary | ICD-10-CM | POA: Insufficient documentation

## 2015-04-23 DIAGNOSIS — K029 Dental caries, unspecified: Secondary | ICD-10-CM | POA: Insufficient documentation

## 2015-04-23 DIAGNOSIS — Z8659 Personal history of other mental and behavioral disorders: Secondary | ICD-10-CM | POA: Insufficient documentation

## 2015-04-23 DIAGNOSIS — K219 Gastro-esophageal reflux disease without esophagitis: Secondary | ICD-10-CM | POA: Insufficient documentation

## 2015-04-23 MED ORDER — AMOXICILLIN 500 MG PO CAPS: 500.0000 mg | ORAL_CAPSULE | Freq: Three times a day (TID) | ORAL | Status: AC

## 2015-04-23 MED ORDER — HYDROCODONE-ACETAMINOPHEN 5-325 MG PO TABS: 1.0000 | ORAL_TABLET | ORAL | Status: AC | PRN

## 2015-04-23 NOTE — ED Provider Notes (Signed)
CSN: [id number]     Arrival date & time 04/23/15  0003 History   First MD Initiated Contact with Patient 04/23/15 0010     Chief Complaint  Patient presents with  . Dental Pain     (Consider location/radiation/quality/duration/timing/severity/associated sxs/prior Treatment) HPI   Deborah [name] is a 29 y.o. female who complains of left upper dental pain for several days. She has an upcoming dental appointment on October 11. No fever, chills, nausea, vomiting, weakness or dizziness. There are no other known modifying factors.   Past Medical History  Diagnosis Date  . Anxiety   . Asthma   . GERD (gastroesophageal reflux disease)   . Stomach ulcer   . Intractable hiccups    Past Surgical History  Procedure Laterality Date  . No past surgeries    . Leg surgery     History reviewed. No pertinent family history. Social History  Substance Use Topics  . Smoking status: Current Every Day Smoker  . Smokeless tobacco: None  . Alcohol Use: No     Comment: OCCAS   OB History    Gravida Para Term Preterm AB TAB SAB Ectopic Multiple Living   0              Review of Systems  All other systems reviewed and are negative.     Allergies  Coconut oil  Home Medications   Prior to Admission medications   Medication Sig Start Date End Date Taking? Authorizing Provider  ibuprofen (ADVIL,MOTRIN) 200 MG tablet Take 400 mg by mouth every 6 (six) hours as needed for moderate pain.   Yes Historical Provider, MD  amoxicillin (AMOXIL) 500 MG capsule Take 1 capsule (500 mg total) by mouth 3 (three) times daily. 04/23/15   Mancel Bale, MD  amoxicillin-clarithromycin-lansoprazole [facility]) combo pack Take by mouth 2 (two) times daily. Follow package directions. Patient not taking: Reported on 04/23/2015 04/18/12   Eber Hong, MD  famotidine (PEPCID) 20 MG tablet Take 1 tablet (20 mg total) by mouth 2 (two) times daily. Patient not taking: Reported on 04/23/2015 06/03/12   Eber Hong, MD   HYDROcodone-acetaminophen [facility]) 5-325 MG tablet Take 1 tablet by mouth every 4 (four) hours as needed. 04/23/15   Mancel Bale, MD  pantoprazole (PROTONIX) 20 MG tablet Take 1 tablet (20 mg total) by mouth daily. Patient not taking: Reported on 04/23/2015 06/03/12   Eber Hong, MD  sucralfate (CARAFATE) 1 G tablet Take 1 tablet (1 g total) by mouth 4 (four) times daily. Patient not taking: Reported on 04/23/2015 06/03/12   Eber Hong, MD   BP 103/70 mmHg  Pulse 70  Temp(Src) 98 F (36.7 C) (Oral)  Resp 20  SpO2 100%  LMP 04/03/2015 Physical Exam  Constitutional: She is oriented to person, place, and time. She appears well-developed and well-nourished.  HENT:  Head: Normocephalic and atraumatic.  Right Ear: External ear normal.  Left Ear: External ear normal.  Carie left upper posterior molar, no associated swelling, or drainage. No trismus.  Eyes: Conjunctivae and EOM are normal. Pupils are equal, round, and reactive to light.  Neck: Normal range of motion and phonation normal. Neck supple.  Cardiovascular: Normal rate and normal heart sounds.   Pulmonary/Chest: Effort normal. She exhibits no bony tenderness.  Musculoskeletal: Normal range of motion.  Neurological: She is alert and oriented to person, place, and time. No cranial nerve deficit or sensory deficit. She exhibits normal muscle tone. Coordination normal.  Skin: Skin is warm, dry and intact.  Psychiatric: She has a normal mood and affect. Her behavior is normal. Judgment and thought content normal.  Nursing note and vitals reviewed.   ED Course  Procedures (including critical care time)  Findings discussed with the patient, all questions were answered  Labs Review Labs Reviewed - No data to display  Imaging Review No results found. I have personally reviewed and evaluated these images and lab results as part of my medical decision-making.   EKG Interpretation None      MDM   Final diagnoses:  Pain  due to dental caries    Dental pain, secondary to carie; with possible infection. Doubt abscess or serious bacterial infection  Nursing Notes Reviewed/ Care Coordinated Applicable Imaging Reviewed Interpretation of Laboratory Data incorporated into ED treatment  The patient appears reasonably screened and/or stabilized for discharge and I doubt any other medical condition or other [facility] requiring further screening, evaluation, or treatment in the ED at this time prior to discharge.  Plan: Home Medications- Amox., Norco; Home Treatments- rest; return here if the recommended treatment, does not improve the symptoms; Recommended follow up- Dentist as scheduled    Mancel Bale, MD 04/23/15 [phone]

## 2015-04-23 NOTE — Discharge Instructions (Signed)
Dental Caries °Dental caries (also called tooth decay) is the most common oral disease. It can occur at any age but is more common in children and young adults.  °HOW DENTAL CARIES DEVELOPS  °The process of decay begins when bacteria and foods (particularly sugars and starches) combine in your mouth to produce plaque. Plaque is a substance that sticks to the hard, outer surface of a tooth (enamel). The bacteria in plaque produce acids that attack enamel. These acids may also attack the root surface of a tooth (cementum) if it is exposed. Repeated attacks dissolve these surfaces and create holes in the tooth (cavities). If left untreated, the acids destroy the other layers of the tooth.  °RISK FACTORS °· Frequent sipping of sugary beverages.   °· Frequent snacking on sugary and starchy foods, especially those that easily get stuck in the teeth.   °· Poor oral hygiene.   °· Dry mouth.   °· Substance abuse such as methamphetamine abuse.   °· Broken or poor-fitting dental restorations.   °· Eating disorders.   °· Gastroesophageal reflux disease (GERD).   °· Certain radiation treatments to the head and neck. °SYMPTOMS °In the early stages of dental caries, symptoms are seldom present. Sometimes white, chalky areas may be seen on the enamel or other tooth layers. In later stages, symptoms may include: °· Pits and holes on the enamel. °· Toothache after sweet, hot, or cold foods or drinks are consumed. °· Pain around the tooth. °· Swelling around the tooth. °DIAGNOSIS  °Most of the time, dental caries is detected during a regular dental checkup. A diagnosis is made after a thorough medical and dental history is taken and the surfaces of your teeth are checked for signs of dental caries. Sometimes special instruments, such as lasers, are used to check for dental caries. Dental X-ray exams may be taken so that areas not visible to the eye (such as between the contact areas of the teeth) can be checked for cavities.    °TREATMENT  °If dental caries is in its early stages, it may be reversed with a fluoride treatment or an application of a remineralizing agent at the dental office. Thorough brushing and flossing at home is needed to aid these treatments. If it is in its later stages, treatment depends on the location and extent of tooth destruction:  °· If a small area of the tooth has been destroyed, the destroyed area will be removed and cavities will be filled with a material such as gold, silver amalgam, or composite resin.   °· If a large area of the tooth has been destroyed, the destroyed area will be removed and a cap (crown) will be fitted over the remaining tooth structure.   °· If the center part of the tooth (pulp) is affected, a procedure called a root canal will be needed before a filling or crown can be placed.   °· If most of the tooth has been destroyed, the tooth may need to be pulled (extracted). °HOME CARE INSTRUCTIONS °You can prevent, stop, or reverse dental caries at home by practicing good oral hygiene. Good oral hygiene includes: °· Thoroughly cleaning your teeth at least twice a day with a toothbrush and dental floss.   °· Using a fluoride toothpaste. A fluoride mouth rinse may also be used if recommended by your dentist or health care provider.   °· Restricting the amount of sugary and starchy foods and sugary liquids you consume.   °· Avoiding frequent snacking on these foods and sipping of these liquids.   °· Keeping regular visits with   a dentist for checkups and cleanings. °PREVENTION  °· Practice good oral hygiene. °· Consider a dental sealant. A dental sealant is a coating material that is applied by your dentist to the pits and grooves of teeth. The sealant prevents food from being trapped in them. It may protect the teeth for several years. °· Ask about fluoride supplements if you live in a community without fluorinated water or with water that has a low fluoride content. Use fluoride supplements  as directed by your dentist or health care provider. °· Allow fluoride varnish applications to teeth if directed by your dentist or health care provider. °  °This information is not intended to replace advice given to you by your health care provider. Make sure you discuss any questions you have with your health care provider. °  °Document Released: 03/25/2002 Document Revised: 07/24/2014 Document Reviewed: 07/05/2012 °[employer] Interactive Patient Education ©2016 [employer] °Dental Care and Dentist Visits °Dental care supports good overall health. Regular dental visits can also help you avoid dental pain, bleeding, infection, and other more serious health problems in the future. It is important to keep the mouth healthy because diseases in the teeth, gums, and other oral tissues can spread to other areas of the body. Some problems, such as diabetes, heart disease, and pre-term labor have been associated with poor oral health.  °See your dentist every 6 months. If you experience emergency problems such as a toothache or broken tooth, go to the dentist right away. If you see your dentist regularly, you may catch problems early. It is easier to be treated for problems in the early stages.  °WHAT TO EXPECT AT A DENTIST VISIT  °Your dentist will look for many common oral health problems and recommend proper treatment. At your regular dental visit, you can expect: °· Gentle cleaning of the teeth and gums. This includes scraping and polishing. This helps to remove the sticky substance around the teeth and gums (plaque). Plaque forms in the mouth shortly after eating. Over time, plaque hardens on the teeth as tartar. If tartar is not removed regularly, it can cause problems. Cleaning also helps remove stains. °· Periodic X-rays. These pictures of the teeth and supporting bone will help your dentist assess the health of your teeth. °· Periodic fluoride treatments. Fluoride is a natural mineral shown to help strengthen  teeth. Fluoride treatment involves applying a fluoride gel or varnish to the teeth. It is most commonly done in children. °· Examination of the mouth, tongue, jaws, teeth, and gums to look for any oral health problems, such as: °· Cavities (dental caries). This is decay on the tooth caused by plaque, sugar, and acid in the mouth. It is best to catch a cavity when it is small. °· Inflammation of the gums caused by plaque buildup (gingivitis). °· Problems with the mouth or malformed or misaligned teeth. °· Oral cancer or other diseases of the soft tissues or jaws.  °KEEP YOUR TEETH AND GUMS HEALTHY °For healthy teeth and gums, follow these general guidelines as well as your dentist's specific advice: °· Have your teeth professionally cleaned at the dentist every 6 months. °· Brush twice daily with a fluoride toothpaste. °· Floss your teeth daily.  °· Ask your dentist if you need fluoride supplements, treatments, or fluoride toothpaste. °· Eat a healthy diet. Reduce foods and drinks with added sugar. °· Avoid smoking. °TREATMENT FOR ORAL HEALTH PROBLEMS °If you have oral health problems, treatment varies depending on the conditions present in your teeth and   gums. °· Your caregiver will most likely recommend good oral hygiene at each visit. °· For cavities, gingivitis, or other oral health disease, your caregiver will perform a procedure to treat the problem. This is typically done at a separate appointment. Sometimes your caregiver will refer you to another dental specialist for specific tooth problems or for surgery. °SEEK IMMEDIATE DENTAL CARE IF: °· You have pain, bleeding, or soreness in the gum, tooth, jaw, or mouth area. °· A permanent tooth becomes loose or separated from the gum socket. °· You experience a blow or injury to the mouth or jaw area. °  °This information is not intended to replace advice given to you by your health care provider. Make sure you discuss any questions you have with your health care  provider. °  °Document Released: 03/15/2011 Document Revised: 09/25/2011 Document Reviewed: 03/15/2011 °[employer] Interactive Patient Education ©2016 [employer] ° °

## 2015-04-23 NOTE — ED Notes (Signed)
Pt has a dentist appt on October 11th

## 2015-04-23 NOTE — ED Notes (Signed)
Pt complains of dental pain on the left side upper and lower
